# Patient Record
Sex: Male | Born: 1935 | Race: Black or African American | Hispanic: No | Smoking: Former smoker
Health system: Southern US, Community
[De-identification: ages and names within clinical notes are randomized; demographics above are authoritative.]

## PROBLEM LIST (undated history)

## (undated) DIAGNOSIS — I10 Essential (primary) hypertension: Secondary | ICD-10-CM

## (undated) DIAGNOSIS — Z9289 Personal history of other medical treatment: Secondary | ICD-10-CM

## (undated) DIAGNOSIS — C801 Malignant (primary) neoplasm, unspecified: Secondary | ICD-10-CM

## (undated) DIAGNOSIS — R350 Frequency of micturition: Secondary | ICD-10-CM

## (undated) DIAGNOSIS — M109 Gout, unspecified: Secondary | ICD-10-CM

## (undated) DIAGNOSIS — I499 Cardiac arrhythmia, unspecified: Secondary | ICD-10-CM

## (undated) DIAGNOSIS — D649 Anemia, unspecified: Secondary | ICD-10-CM

## (undated) DIAGNOSIS — R609 Edema, unspecified: Secondary | ICD-10-CM

## (undated) DIAGNOSIS — I4892 Unspecified atrial flutter: Secondary | ICD-10-CM

## (undated) DIAGNOSIS — M1990 Unspecified osteoarthritis, unspecified site: Secondary | ICD-10-CM

## (undated) HISTORY — DX: Unspecified atrial flutter: I48.92

## (undated) HISTORY — PX: CATARACT EXTRACTION, BILATERAL: SHX1313

## (undated) HISTORY — PX: MINOR HEMORRHOIDECTOMY: SHX6238

---

## 1997-06-24 ENCOUNTER — Ambulatory Visit (HOSPITAL_COMMUNITY): Admission: RE | Admit: 1997-06-24 | Discharge: 1997-06-24 | Payer: Self-pay | Admitting: Internal Medicine

## 1997-07-16 ENCOUNTER — Ambulatory Visit (HOSPITAL_COMMUNITY): Admission: RE | Admit: 1997-07-16 | Discharge: 1997-07-16 | Payer: Self-pay | Admitting: Internal Medicine

## 1998-06-01 ENCOUNTER — Ambulatory Visit (HOSPITAL_COMMUNITY): Admission: RE | Admit: 1998-06-01 | Discharge: 1998-06-01 | Payer: Self-pay | Admitting: Gastroenterology

## 2001-11-21 ENCOUNTER — Encounter: Payer: Self-pay | Admitting: Internal Medicine

## 2001-11-21 ENCOUNTER — Encounter: Admission: RE | Admit: 2001-11-21 | Discharge: 2001-11-21 | Payer: Self-pay | Admitting: Internal Medicine

## 2002-07-15 ENCOUNTER — Ambulatory Visit: Admission: RE | Admit: 2002-07-15 | Discharge: 2002-09-25 | Payer: Self-pay | Admitting: Radiation Oncology

## 2002-07-26 ENCOUNTER — Encounter: Admission: RE | Admit: 2002-07-26 | Discharge: 2002-07-26 | Payer: Self-pay | Admitting: Urology

## 2002-07-26 ENCOUNTER — Encounter: Payer: Self-pay | Admitting: Urology

## 2002-08-23 ENCOUNTER — Encounter: Payer: Self-pay | Admitting: Urology

## 2002-08-23 ENCOUNTER — Ambulatory Visit (HOSPITAL_BASED_OUTPATIENT_CLINIC_OR_DEPARTMENT_OTHER): Admission: RE | Admit: 2002-08-23 | Discharge: 2002-08-23 | Payer: Self-pay | Admitting: Urology

## 2006-02-13 ENCOUNTER — Encounter: Admission: RE | Admit: 2006-02-13 | Discharge: 2006-02-13 | Payer: Self-pay | Admitting: Internal Medicine

## 2008-04-10 ENCOUNTER — Encounter: Admission: RE | Admit: 2008-04-10 | Discharge: 2008-04-10 | Payer: Self-pay | Admitting: Internal Medicine

## 2008-08-29 ENCOUNTER — Encounter: Admission: RE | Admit: 2008-08-29 | Discharge: 2008-08-29 | Payer: Self-pay | Admitting: Orthopedic Surgery

## 2008-10-30 ENCOUNTER — Encounter: Payer: Self-pay | Admitting: Cardiovascular Disease

## 2008-11-04 ENCOUNTER — Ambulatory Visit (HOSPITAL_COMMUNITY): Admission: RE | Admit: 2008-11-04 | Discharge: 2008-11-04 | Payer: Self-pay | Admitting: Orthopedic Surgery

## 2010-01-20 ENCOUNTER — Encounter: Payer: Self-pay | Admitting: Cardiovascular Disease

## 2010-01-21 ENCOUNTER — Encounter: Payer: Self-pay | Admitting: Cardiovascular Disease

## 2010-01-22 ENCOUNTER — Ambulatory Visit (HOSPITAL_COMMUNITY)
Admission: RE | Admit: 2010-01-22 | Discharge: 2010-01-26 | Payer: Self-pay | Source: Home / Self Care | Admitting: Orthopedic Surgery

## 2010-01-22 ENCOUNTER — Encounter: Payer: Self-pay | Admitting: Cardiovascular Disease

## 2010-02-09 DIAGNOSIS — I10 Essential (primary) hypertension: Secondary | ICD-10-CM | POA: Insufficient documentation

## 2010-02-09 DIAGNOSIS — M109 Gout, unspecified: Secondary | ICD-10-CM

## 2010-02-10 ENCOUNTER — Ambulatory Visit: Payer: Self-pay | Admitting: Cardiovascular Disease

## 2010-02-10 DIAGNOSIS — I4892 Unspecified atrial flutter: Secondary | ICD-10-CM

## 2010-02-10 HISTORY — DX: Unspecified atrial flutter: I48.92

## 2010-02-17 ENCOUNTER — Telehealth (INDEPENDENT_AMBULATORY_CARE_PROVIDER_SITE_OTHER): Payer: Self-pay | Admitting: Radiology

## 2010-02-18 ENCOUNTER — Encounter: Payer: Self-pay | Admitting: Internal Medicine

## 2010-02-18 ENCOUNTER — Ambulatory Visit: Payer: Self-pay

## 2010-02-18 ENCOUNTER — Encounter (HOSPITAL_COMMUNITY)
Admission: RE | Admit: 2010-02-18 | Discharge: 2010-05-01 | Payer: Self-pay | Source: Home / Self Care | Attending: Cardiovascular Disease | Admitting: Cardiovascular Disease

## 2010-02-18 ENCOUNTER — Encounter (INDEPENDENT_AMBULATORY_CARE_PROVIDER_SITE_OTHER): Payer: Self-pay | Admitting: *Deleted

## 2010-02-18 ENCOUNTER — Ambulatory Visit: Payer: Self-pay | Admitting: Cardiovascular Disease

## 2010-02-18 ENCOUNTER — Ambulatory Visit (HOSPITAL_COMMUNITY)
Admission: RE | Admit: 2010-02-18 | Discharge: 2010-02-18 | Payer: Self-pay | Source: Home / Self Care | Admitting: Cardiovascular Disease

## 2010-02-18 ENCOUNTER — Ambulatory Visit: Payer: Self-pay | Admitting: Internal Medicine

## 2010-02-18 DIAGNOSIS — R9430 Abnormal result of cardiovascular function study, unspecified: Secondary | ICD-10-CM | POA: Insufficient documentation

## 2010-02-18 DIAGNOSIS — I4510 Unspecified right bundle-branch block: Secondary | ICD-10-CM | POA: Insufficient documentation

## 2010-02-19 LAB — CONVERTED CEMR LAB
Basophils Absolute: 0.1 10*3/uL (ref 0.0–0.1)
Calcium: 10 mg/dL (ref 8.4–10.5)
Eosinophils Relative: 1.8 % (ref 0.0–5.0)
GFR calc non Af Amer: 43.89 mL/min (ref >60)
Glucose, Bld: 86 mg/dL (ref 70–99)
Hemoglobin: 11.2 g/dL — ABNORMAL LOW (ref 13.0–17.0)
Lymphocytes Relative: 34.4 % (ref 12.0–46.0)
Monocytes Relative: 11.7 % (ref 3.0–12.0)
Neutro Abs: 3.3 10*3/uL (ref 1.4–7.7)
Potassium: 4 meq/L (ref 3.5–5.1)
Prothrombin Time: 10.5 s (ref 9.7–11.8)
RDW: 14.2 % (ref 11.5–14.6)
Sodium: 137 meq/L (ref 135–145)
WBC: 6.6 10*3/uL (ref 4.5–10.5)

## 2010-02-22 ENCOUNTER — Inpatient Hospital Stay (HOSPITAL_BASED_OUTPATIENT_CLINIC_OR_DEPARTMENT_OTHER): Admission: RE | Admit: 2010-02-22 | Discharge: 2010-02-22 | Payer: Self-pay | Admitting: Cardiovascular Disease

## 2010-02-22 ENCOUNTER — Encounter: Payer: Self-pay | Admitting: Cardiovascular Disease

## 2010-02-22 ENCOUNTER — Ambulatory Visit: Payer: Self-pay | Admitting: Cardiovascular Disease

## 2010-03-08 ENCOUNTER — Telehealth: Payer: Self-pay | Admitting: Cardiovascular Disease

## 2010-03-09 ENCOUNTER — Ambulatory Visit: Payer: Self-pay | Admitting: Cardiology

## 2010-03-09 ENCOUNTER — Inpatient Hospital Stay (HOSPITAL_COMMUNITY): Admission: RE | Admit: 2010-03-09 | Discharge: 2010-03-16 | Payer: Self-pay | Admitting: Orthopedic Surgery

## 2010-03-18 ENCOUNTER — Encounter: Payer: Self-pay | Admitting: Cardiovascular Disease

## 2010-03-18 LAB — CONVERTED CEMR LAB
POC INR: 4
Prothrombin Time: 4 s

## 2010-03-24 ENCOUNTER — Encounter: Payer: Self-pay | Admitting: Cardiology

## 2010-03-31 ENCOUNTER — Encounter: Payer: Self-pay | Admitting: Internal Medicine

## 2010-04-07 ENCOUNTER — Ambulatory Visit: Payer: Self-pay | Admitting: Internal Medicine

## 2010-04-12 ENCOUNTER — Ambulatory Visit: Payer: Self-pay | Admitting: Cardiology

## 2010-04-12 ENCOUNTER — Encounter: Payer: Self-pay | Admitting: Cardiovascular Disease

## 2010-04-12 ENCOUNTER — Ambulatory Visit: Payer: Self-pay | Admitting: Cardiovascular Disease

## 2010-04-12 DIAGNOSIS — Z96659 Presence of unspecified artificial knee joint: Secondary | ICD-10-CM | POA: Insufficient documentation

## 2010-06-01 NOTE — Consult Note (Signed)
Summary: [facility]. Pre-Op Consultation Report  [facility]. Pre-Op Consultation Report   Imported By: Earl Many 02/09/2010 16:39:10  _____________________________________________________________________  External Attachment:    Type:   Image     Comment:   External Document

## 2010-06-01 NOTE — Progress Notes (Signed)
Summary: NUC PRE=PROCEDURE  Phone Note Outgoing Call   Call placed by: Domenic Polite, CNMT,  February 17, 2010 4:01 PM Call placed to: Patient Reason for Call: Confirm/change Appt Summary of Call: Left message with information on Myoview Information Sheet (see scanned document for details).  Initial call taken by: Domenic Polite, CNMT,  February 17, 2010 4:01 PM     Nuclear Med Background Indications for Stress Test: Evaluation for Ischemia, Surgical Clearance  Indications Comments: LT KNEE       Nuclear Pre-Procedure Cardiac Risk Factors: Hypertension

## 2010-06-01 NOTE — Medication Information (Signed)
Summary: Coumadin Clinic  Anticoagulant Therapy  Managed by: Weston Brass, PharmD Referring MD: Charlton Haws, MD Supervising MD: Gala Romney MD, Reuel Boom Indication 1: Atrial Fibrillation Lab Used: [facility] Site: [facility] PT 14.7 INR POC 1.5 INR RANGE 2.0-2.5  Dietary changes: no    Health status changes: no    Bleeding/hemorrhagic complications: no    Recent/future hospitalizations: no    Any changes in medication regimen? no    Recent/future dental: no  Any missed doses?: no       Is patient compliant with meds? yes       Allergies: 1)  ! Asa  Anticoagulation Management History:      His anticoagulation is being managed by telephone today.  Positive risk factors for bleeding include an age of 27 years or older and presence of serious comorbidities.  The bleeding index is 'intermediate risk'.  Positive CHADS2 values include History of HTN and Age > 7 years old.  His last INR was 1.0 ratio.  Prothrombin time is 14.7.  Anticoagulation responsible provider: Bensimhon MD, Reuel Boom.  INR POC: 1.5.    Anticoagulation Management Assessment/Plan:      The target INR is 2.0-2.5.  The next INR is due 04/07/2010.  Anticoagulation instructions were given to patient.  Results were reviewed/authorized by Weston Brass, PharmD.  He was notified by Weston Brass PharmD.         Prior Anticoagulation Instructions: INR 1.4  Attempted to reach pt.  Line busy. Weston Brass PharmD  March 24, 2010 2:46 PM   Spoke with pt.  Take 1 1/2 tablets today then increase dose to 1 tablet every day except 1/2 tablet on Monday.  Recheck INR in 1 week.  Orders faxed to [facility].   Current Anticoagulation Instructions: INR 1.5  Spoke with pt.  Take 1 1/2 tablets today and tomorrow then increase dose to 1 tablet every day.  Recheck INR in 1 week.  Orders faxed to [facility].

## 2010-06-01 NOTE — Medication Information (Signed)
Summary: Coumadin Clinic  Anticoagulant Therapy  Managed by: Weston Brass, PharmD Referring MD: Charlton Haws, MD Supervising MD: Shirlee Latch MD, Dalton Indication 1: Atrial Fibrillation Lab Used: [facility] Site: [facility] PT 13.9 INR POC 1.4 INR RANGE 2.0-2.5  Dietary changes: yes       Details: ate green vegetables 2 times  Health status changes: no    Bleeding/hemorrhagic complications: no    Recent/future hospitalizations: no    Any changes in medication regimen? no    Recent/future dental: no  Any missed doses?: no       Is patient compliant with meds? yes       Allergies: 1)  ! Asa  Anticoagulation Management History:      His anticoagulation is being managed by telephone today.  Positive risk factors for bleeding include an age of 75 years or older and presence of serious comorbidities.  The bleeding index is 'intermediate risk'.  Positive CHADS2 values include History of HTN and Age > 1 years old.  His last INR was 1.0 ratio.  Prothrombin time is 13.9.  Anticoagulation responsible Aaliyana Fredericks: Shirlee Latch MD, Dalton.  INR POC: 1.4.    Anticoagulation Management Assessment/Plan:      The target INR is 2.0-2.5.  The next INR is due 03/31/2010.  Anticoagulation instructions were given to patient.  Results were reviewed/authorized by Weston Brass, PharmD.  He was notified by Weston Brass PharmD.         Prior Anticoagulation Instructions: INR 4.0  Do NOT take coumadin today.  Then start NEW schedule of 1/2 tablet on Friday and Monday and 1 tablet all other days.  Recheck INR in 5 days.  Instructions given to patient.  EAC  Current Anticoagulation Instructions: INR 1.4  Attempted to reach pt.  Line busy. Weston Brass PharmD  March 24, 2010 2:46 PM   Spoke with pt.  Take 1 1/2 tablets today then increase dose to 1 tablet every day except 1/2 tablet on Monday.  Recheck INR in 1 week.  Orders faxed to [facility].

## 2010-06-01 NOTE — Assessment & Plan Note (Signed)
Summary: Cardiology Nuclear Testing  Nuclear Med Background Indications for Stress Test: Evaluation for Ischemia, Surgical Clearance  Indications Comments: Pending (L) knee surgery by Dr. Dorene Grebe   History Comments: No documented CAD. h/o atrial flutter  Symptoms: DOE    Nuclear Pre-Procedure Cardiac Risk Factors: History of Smoking, Hypertension, Obesity, RBBB Caffeine/Decaff Intake: None NPO After: 5:30 PM Lungs: Clear.  O2 Sat 96% on RA. IV 0.9% NS with Angio Cath: 22g     IV Site: R Antecubital IV Started by: Bonnita Levan, RN Chest Size (in) 44     Height (in): 65 Weight (lb): 197 BMI: 32.90  Nuclear Med Study 1 or 2 day study:  1 day     Stress Test Type:  Eugenie Birks Reading MD:  Arvilla Meres, MD     Referring MD:  Charlton Haws, MD Resting Radionuclide:  Technetium 79m Tetrofosmin     Resting Radionuclide Dose:  11 mCi  Stress Radionuclide:  Technetium 58m Tetrofosmin     Stress Radionuclide Dose:  33 mCi   Stress Protocol   Lexiscan: 0.4 mg   Stress Test Technologist:  [employer]a [employer], CMA-N     Nuclear Technologist:  Domenic Polite, CNMT  Rest Procedure  Myocardial perfusion imaging was performed at rest 45 minutes following the intravenous administration of Technetium 74m Tetrofosmin.  Stress Procedure  The patient received IV Lexiscan 0.4 mg over 15-seconds.  Technetium 20m Tetrofosmin injected at 30-seconds.  There were no significant changes with infusion.  Quantitative spect images were obtained after a 45 minute delay.  QPS Raw Data Images:  Normal; no motion artifact; normal heart/lung ratio. Stress Images:  There is decreased uptake in the inferior wall Rest Images:  There is decreased uptake in the inferior wall. Subtraction (SDS):  There is a primarily fixed defect in the inferior wall suggestive of previous infarct with trivial peri-infarct ischemia. Transient Ischemic Dilatation:  .92  (Normal <1.22)  Lung/Heart Ratio:  .25  (Normal  <0.45)  Quantitative Gated Spect Images QGS EDV:  107 ml QGS ESV:  38 ml QGS EF:  64 % QGS cine images:  Normal.  Findings Abnormal nuclear study      Overall Impression  Exercise Capacity: Lexiscan with no exercise. ECG Impression: No significant ST segment change suggestive of ischemia. Overall Impression: Abnormal stress nuclear study. Overall Impression Comments: There is a primarily fixed defect in the inferior wall suggestive of previous infarct with trivial peri-infarct ischemia.

## 2010-06-01 NOTE — Assessment & Plan Note (Signed)
Summary: surgical clearance/ echo and myoview today at 8am/sl   CC:  pt wil call us back about meds.  History of Present Illness: Glenroy is referred by Dorene Grebe for preop clearance.  He needs a left knee replacement.  He has HTN and chronic RBBB.l  He was found to have atrial flutter on preop ECG.  He has never had a heart problem.  He uses an exercise bicycle 2x/day with no difficulty.  He has not had any recent surgery.  He had right arthroscopic knee surgery many years ago wtih no complicaitons.  He denies history of CAD, sscp, dyspnea, palpitations, edema or syncope.  He has never had a TIA or stroke.  he is compliant with his meds.  Many years ago he had polyps removed and bled "freely" so he was told not to take ASA He is otherwise low risk for an octogenarian  I reviewed his myovue and echo from today.  Echo showed no discrete RWMA;s EF 60% with mild MR Myovue showed inferior wall infarct with ischemia.  His ECG for the myovue showed NSR and no flutter with RBBB  Given his functional status, age, arrythmia and positive myovue I think he should have a cath before elective orthopedic surgery.  Risks discussed.  Willing to proceed.  Would like to be done Monday.  Will arrange with Dr Excell Seltzer  Current Problems (verified): 1)  Atrial Flutter  (ICD-427.32) 2)  Hypertension  (ICD-401.9) 3)  Gout  (ICD-274.9)  Current Medications (verified): 1)  Benicar .... Take One Tablet By Mouth Daily 2)  Allopurinol .Marland Kitchen.. 1 Tab By Mouth Once Daily 3)  Fosamax .Marland Kitchen.. 1 Tab By Mouth Once Daily 4)  Atenolol 50 Mg Tabs (Atenolol) .... Take I Tab By Mouth Daily  Allergies (verified): 1)  ! Jonne Ply  Past History:  Past Medical History: Last updated: 02/09/2010 Atrial Flutter: seen on preop orhto ECG 9/11 HYPERTENSION  GOUT   History of right knee arthroscopy  Past Surgical History: Last updated: 02/09/2010  Right knee partial medial and lateral meniscectomy.   Family History: Last updated:  02/09/2010 noncontributory  Social History: Last updated: 02/10/2010 Widowed Neice looks in on him Nonsmoker Nondrinker Retired from post office  Review of Systems       Denies fever, malais, weight loss, blurry vision, decreased visual acuity, cough, sputum, SOB, hemoptysis, pleuritic pain, palpitaitons, heartburn, abdominal pain, melena, lower extremity edema, claudication, or rash.   Vital Signs:  Patient profile:   77 year old male Weight:      185 pounds Pulse rate:   50 / minute BP sitting:   130 / 65  (left arm)  Vitals Entered By: Kem Parkinson (February 18, 2010 1:47 PM)  Physical Exam  General:  Affect appropriate Healthy:  appears stated age HEENT: normal Neck supple with no adenopathy JVP normal no bruits no thyromegaly Lungs clear with no wheezing and good diaphragmatic motion Heart:  S1/S2 no murmur,rub, gallop or click PMI normal Abdomen: benighn, BS positve, no tenderness, no AAA no bruit.  No HSM or HJR Distal pulses intact with no bruits No edema Neuro non-focal Skin warm and dry    Impression & Recommendations:  Problem # 1:  NONSPECIFIC ABNORMAL UNSPEC CV FUNCTION STUDY (ICD-794.30) Preoperative evaluation with positvie myovue inferior ischemia and infarct.  Cath on Monday Orders: TLB-BMP (Basic Metabolic Panel-BMET) (80048-METABOL) TLB-CBC Platelet - w/Differential (85025-CBCD) TLB-PT (Protime) (85610-PTP) T-2 View CXR (71020TC) Cardiac Catheterization (Cardiac Cath)  Problem # 2:  ATRIAL FLUTTER (ICD-427.32)  Paroxysmal.  Continue BB.  No antiarrythmics as cornary anatomy not defined.   His updated medication list for this problem includes:    Atenolol 50 Mg Tabs (Atenolol) .Marland Kitchen... Take i tab by mouth daily  Problem # 3:  HYPERTENSION (ICD-401.9) Well controlled  His updated medication list for this problem includes:    Atenolol 50 Mg Tabs (Atenolol) .Marland Kitchen... Take i tab by mouth daily  Problem # 4:  RBBB (ICD-426.4) No evidence of  high grade heart block.  Continue BB His updated medication list for this problem includes:    Atenolol 50 Mg Tabs (Atenolol) .Marland Kitchen... Take i tab by mouth daily  Patient Instructions: 1)  Your physician recommends that you schedule a follow-up appointment in: 4weeks 2)  Your physician has requested that you have a cardiac catheterization.  Cardiac catheterization is used to diagnose and/or treat various heart conditions. Doctors may recommend this procedure for a number of different reasons. The most common reason is to evaluate chest pain. Chest pain can be a symptom of coronary artery disease (CAD), and cardiac catheterization can show whether plaque is narrowing or blocking your heart's arteries. This procedure is also used to evaluate the valves, as well as measure the blood flow and oxygen levels in different parts of your heart.  For further information please visit [email or web address]  Please follow instruction sheet, as given.

## 2010-06-01 NOTE — Medication Information (Signed)
Summary: rov/sp  Anticoagulant Therapy  Managed by: Weston Brass, PharmD Referring MD: Charlton Haws, MD Supervising MD: Ladona Ridgel MD, Sharlot Gowda Indication 1: Atrial Fibrillation Lab Used: [facility] Site: [facility] INR POC 3.2 INR RANGE 2.0-2.5  Dietary changes: no    Health status changes: no    Bleeding/hemorrhagic complications: no    Recent/future hospitalizations: no    Any changes in medication regimen? no    Recent/future dental: no  Any missed doses?: no       Is patient compliant with meds? yes       Allergies: 1)  ! Asa  Anticoagulation Management History:      The patient is taking warfarin and comes in today for a routine follow up visit.  Positive risk factors for bleeding include an age of 2 years or older and presence of serious comorbidities.  The bleeding index is 'intermediate risk'.  Positive CHADS2 values include History of HTN and Age > 64 years old.  His last INR was 1.0 ratio.  Anticoagulation responsible provider: Ladona Ridgel MD, Sharlot Gowda.  INR POC: 3.2.  Cuvette Lot#: [id number].  Exp: 06/2011.    Anticoagulation Management Assessment/Plan:      The target INR is 2.0-2.5.  The next INR is due 04/12/2010.  Anticoagulation instructions were given to patient.  Results were reviewed/authorized by Weston Brass, PharmD.  He was notified by Weston Brass PharmD.         Prior Anticoagulation Instructions: INR 1.5  Spoke with pt.  Take 1 1/2 tablets today and tomorrow then increase dose to 1 tablet every day.  Recheck INR in 1 week.  Orders faxed to [facility].   Current Anticoagulation Instructions: INR 3.2  Skip today's dose of Coumadin then decrease dose to 1 tablet every day except 1/2 tablet on Monday.  Recheck INR in 1 week.

## 2010-06-01 NOTE — Assessment & Plan Note (Signed)
Summary: np3/abn ekg   History of Present Illness: [name] is referred by Dorene Grebe for preop clearance.  He needs a left knee replacement.  He has HTN and chronic RBBB.l  He was found to have atrial flutter on preop ECG.  He has never had a heart problem.  He uses an exercise bicycle 2x/day with no difficulty.  He has not had any recent surgery.  He had right arthroscopic knee surgery many years ago wtih no complicaitons.  He denies history of CAD, sscp, dyspnea, palpitations, edema or syncope.  He has never had a TIA or stroke.  he is compliant with his meds.  Many years ago he had polyps removed and bled "freely" so he was told not to take ASA He is otherwise low risk for an octogenarian  Current Problems (verified): 1)  Atrial Flutter  (ICD-427.32) 2)  Hypertension  (ICD-401.9) 3)  Gout  (ICD-274.9)  Current Medications (verified): 1)  Benicar .... Take One Tablet By Mouth Daily 2)  Allopurinol .Marland Kitchen.. 1 Tab By Mouth Once Daily 3)  Fosamax .Marland Kitchen.. 1 Tab By Mouth Once Daily 4)  Atenolol 50 Mg Tabs (Atenolol) .... Take I Tab By Mouth Daily  Allergies (verified): 1)  ! Jonne Ply  Past History:  Past Medical History: Last updated: 02/09/2010 Atrial Flutter: seen on preop orhto ECG 9/11 HYPERTENSION  GOUT   History of right knee arthroscopy  Past Surgical History: Last updated: 02/09/2010  Right knee partial medial and lateral meniscectomy.   Family History: Last updated: 02/09/2010 noncontributory  Social History: Last updated: 02/10/2010 Widowed Neice looks in on him Nonsmoker Nondrinker Retired from post office  Social History: Widowed Neice looks in on him Nonsmoker Nondrinker Retired from post office  Review of Systems       Denies fever, malais, weight loss, blurry vision, decreased visual acuity, cough, sputum, SOB, hemoptysis, pleuritic pain, palpitaitons, heartburn, abdominal pain, melena, lower extremity edema, claudication, or rash.   Vital Signs:  Patient  profile:   77 year old male Pulse rate:   120 / minute BP supine:   130 / 70  Physical Exam  General:  Affect appropriate Healthy:  appears stated age HEENT: normal Neck supple with no adenopathy JVP normal no bruits no thyromegaly Lungs clear with no wheezing and good diaphragmatic motion Heart:  S1/S2 systolic  murmur,rub, gallop or click PMI normal Abdomen: benighn, BS positve, no tenderness, no AAA no bruit.  No HSM or HJR Distal pulses intact with no bruits No edema Neuro non-focal Skin warm and dry Arthritic left knee with decreased ROM   Impression & Recommendations:  Problem # 1:  ATRIAL FLUTTER (ICD-427.32) Needs rate controll.  Add BB.  R/O stuctural heart disease with echo and myovue.  Deal with conversion, anticoagulation and possible ablatin post op as stress of surgery may cause recurrent flutter and would need to anticoagulate continuously for 3-4 weeks His updated medication list for this problem includes:    Atenolol 50 Mg Tabs (Atenolol) .Marland Kitchen... Take i tab by mouth daily  Orders: Echocardiogram (Echo)  Problem # 2:  HYPERTENSION (ICD-401.9) Continue Benicar His updated medication list for this problem includes:    Atenolol 50 Mg Tabs (Atenolol) .Marland Kitchen... Take i tab by mouth daily  Orders: Nuclear Stress Test (Nuc Stress Test)  Problem # 3:  GOUT (ICD-274.9) Likely contributory to arthritis.  F/U uric acid level primary and consdier Allopurinol  Patient Instructions: 1)  Your physician recommends that you schedule a follow-up appointment in: schedule  appoint soon after testing done for surgical clearance 2)  Your physician has recommended you make the following change in your medication: please start atenolol 50mg  1 tab by mouth everyday 3)  Your physician has requested that you have an echocardiogram.  Echocardiography is a painless test that uses sound waves to create images of your heart. It provides your doctor with information about the size and shape of  your heart and how well your heart's chambers and valves are working.  This procedure takes approximately one hour. There are no restrictions for this procedure. 4)  Your physician has requested that you have an adenosine myoview.  For further information please visit [email or web address]  Please follow instruction sheet, as given. Prescriptions: ATENOLOL 50 MG TABS (ATENOLOL) take i tab by mouth daily  #90 x 3   Entered by:   Ledon Snare, RN   Authorized by:   Colon Branch, MD, [facility]   Signed by:   Ledon Snare, RN on 02/10/2010   Method used:   Electronically to        [facility] [phone]* (retail)       [address]       [place], [place]  [id number]       Ph: [phone] or [phone]       Fax: [phone]   RxID:   [id number]    EKG Report  Procedure date:  01/22/2010  Findings:      Atrial Flutter with RBBB RBBB seen on old ECG 2010 Flutter is new

## 2010-06-01 NOTE — Medication Information (Signed)
Summary: Coumadin Clinic  Anticoagulant Therapy  Managed by: Eda Keys, PharmD Referring MD: Charlton Haws, MD Supervising MD: Clifton James MD, Cristal Deer Indication 1: Atrial Fibrillation Lab Used: [facility] Site: [address] PT 4.0 INR POC 4.0 INR RANGE 2.0-2.5          Comments: Patient is currently on coumadin for DVT Px s/p left TKR on Nov 8th.  Patient was discharged on November 14th.  Patient was given coumadin 5 mg daily while in hospital and INRs ranged from 1.34 - 2.38 while in hospital.  His INR is being drawn by [place].  Patient was discharged with instructions to continue coumadin 5 mg daily.  Patient reports that he has received coumadin education from [facility] RN.    Allergies: 1)  ! Asa  Anticoagulation Management History:      His anticoagulation is being managed by telephone today.  Positive risk factors for bleeding include an age of 75 years or older and presence of serious comorbidities.  The bleeding index is 'intermediate risk'.  Positive CHADS2 values include History of HTN and Age > 52 years old.  His last INR was 1.0 ratio.  Prothrombin time is 4.0.  Anticoagulation responsible provider: Clifton James MD, Cristal Deer.  INR POC: 4.0.    Anticoagulation Management Assessment/Plan:      The next INR is due 03/23/2010.  Anticoagulation instructions were given to patient.  Results were reviewed/authorized by Eda Keys, PharmD.  He was notified by Eda Keys.         Current Anticoagulation Instructions: INR 4.0  Do NOT take coumadin today.  Then start NEW schedule of 1/2 tablet on Friday and Monday and 1 tablet all other days.  Recheck INR in 5 days.  Instructions given to patient.  EAC

## 2010-06-01 NOTE — Letter (Signed)
Summary: Cardiac Catheterization Instructions- [facility]  [facility], Main Office  [address]   [place], [place] [address]   Phone: [phone]  Fax: [phone]     02/18/2010 MRN: [id number]  [facility] [address] [place], [place]  [address]  Dear Mr. Salminen,   You are scheduled for a Cardiac Catheterization on MONDAY 02-22-10 with Dr. Excell Seltzer  Please arrive to the 1st floor of the Heart and Vascular Center at [facility] at      6:30 am      on the day of your procedure. Please do not arrive before 6:30 a.m. Call the Heart and Vascular Center at [phone] if you are unable to make your appointmnet. The Code to get into the parking garage under the building is  0200. Take the elevators to the 1st floor. You must have someone to drive you home. Someone must be with you for the first 24 hours after you arrive home. Please wear clothes that are easy to get on and off and wear slip-on shoes. Do not eat or drink after midnight except water with your medications that morning. Bring all your medications and current insurance cards with you.  ___ DO NOT take these medications before your procedure: ________________________________________________________________  ___ Make sure you take your aspirin.  ___ You may take ALL of your medications with water that morning. ________________________________________________________________________________________________________________________________  ___ DO NOT take ANY medications before your procedure.  ___ Pre-med instructions:  ________________________________________________________________________________________________________________________________  The usual length of stay after your procedure is 2 to 3 hours. This can vary.  If you have any questions, please call the office at the number listed above.   Deliah Goody, RN

## 2010-06-01 NOTE — Progress Notes (Signed)
Summary: ekg still show atrial flutter  Phone Note From Other Clinic Call back at [facility] Phone [phone]   Caller: dr. dean office- sherrri [phone] Request: Talk with Nurse Summary of Call: pt is schedule for total knee / ekg still showing atrial flutter. pls advise.  Initial call taken by: Lorne Skeens,  March 08, 2010 2:47 PM  Follow-up for Phone Call        spoke with sherri, pt is scheduled for surgery tomorrow and they want to make sure from an atrial flutter respect is he still clear. will discuss with dr allred(DOD). Deliah Goody, RN  March 08, 2010 3:44 PM  discussed above with dr allred, clearence given to pt for procedure. note faxed to dr dean's office Deliah Goody, RN  March 08, 2010 4:18 PM

## 2010-06-01 NOTE — Cardiovascular Report (Signed)
Summary: Pre-Cath Orders  Pre-Cath Orders   Imported By: Marylou Mccoy 03/08/2010 11:27:17  _____________________________________________________________________  External Attachment:    Type:   Image     Comment:   External Document

## 2010-06-03 NOTE — Assessment & Plan Note (Signed)
Summary: eph/jt   CC:  follow up.Marland KitchenMarland Kitchenpt will call us back to verify meds.  History of Present Illness: Zeven is seen today for F/U of PAF.  He is S/P knee surgery where he had PAF.  Preop w/u with cath showed normal cors with good LV function.  False positive myovue.  Tends to be bradycardic and BB dose recently lowered.  His surgery was 11/8 and since he is in NSR we should be able to stop his coumadin.  I will talk with Dr Dorene Grebe regarding length of coumadin post knee but I suspect since patient is ambulatory and there is no swelling or clincial evidence of DVT he should be able to come off coumadin from an orthopedic perspective  Current Problems (verified): 1)  Rbbb  (ICD-426.4) 2)  Nonspecific Abnormal Unspec Cv Function Study  (ICD-794.30) 3)  Atrial Flutter  (ICD-427.32) 4)  Hypertension  (ICD-401.9) 5)  Gout  (ICD-274.9)  Current Medications (verified): 1)  Allopurinol 100 Mg Tabs (Allopurinol) .Marland Kitchen.. 1 Tab By Mouth Once Daily 2)  Fosamax .Marland Kitchen.. 1 Tab By Mouth Once Daily 3)  Atenolol 50 Mg Tabs (Atenolol) .Marland Kitchen.. 1 Tab By Mouth Once Daily 4)  Methocarbamol 500 Mg Tabs (Methocarbamol) .... As Needed 5)  Coumadin  (Warfarin Sodium) .... As Directed  Allergies (verified): 1)  ! Jonne Ply  Past History:  Past Medical History: Last updated: 02/09/2010 Atrial Flutter: seen on preop orhto ECG 9/11 HYPERTENSION  GOUT   History of right knee arthroscopy  Past Surgical History: Last updated: 02/09/2010  Right knee partial medial and lateral meniscectomy.   Family History: Last updated: 02/09/2010 noncontributory  Social History: Last updated: 02/10/2010 Widowed Neice looks in on him Nonsmoker Nondrinker Retired from post office  Review of Systems       Denies fever, malais, weight loss, blurry vision, decreased visual acuity, cough, sputum, SOB, hemoptysis, pleuritic pain, palpitaitons, heartburn, abdominal pain, melena, lower extremity edema, claudication, or  rash.   Vital Signs:  Patient profile:   77 year old male Height:      65 inches Weight:      197 pounds BMI:     32.90 Pulse rate:   57 / minute Resp:     14 per minute BP sitting:   131 / 63  (left arm)  Vitals Entered By: Kem Parkinson (April 12, 2010 11:08 AM)  Physical Exam  General:  Affect appropriate Healthy:  appears stated age HEENT: normal Neck supple with no adenopathy JVP normal no bruits no thyromegaly Lungs clear with no wheezing and good diaphragmatic motion Heart:  S1/S2 no murmur,rub, gallop or click PMI normal Abdomen: benighn, BS positve, no tenderness, no AAA no bruit.  No HSM or HJR Distal pulses intact with no bruits No edema Neuro non-focal Skin warm and dry S/P left TKR   Impression & Recommendations:  Problem # 1:  ATRIAL FLUTTER (ICD-427.32) Resolved  Continue low dose BB  Stop coumadin His updated medication list for this problem includes:    Atenolol 50 Mg Tabs (Atenolol) .Marland Kitchen... 1 tab by mouth once daily  Problem # 2:  HYPERTENSION (ICD-401.9) Well controlled His updated medication list for this problem includes:    Atenolol 50 Mg Tabs (Atenolol) .Marland Kitchen... 1 tab by mouth once daily    Aspirin 81 Mg Tbec (Aspirin) .Marland Kitchen... Take one tablet by mouth daily  Problem # 3:  NONSPECIFIC ABNORMAL UNSPEC CV FUNCTION STUDY (ICD-794.30) Abnormal myovue but no critical CAD on cath.  Baby ASA and  BB  Problem # 4:  TOTAL KNEE REPLACEMENT, LEFT, HX OF (ICD-V43.65) Personallhy spoke with DR Dorene Grebe today.  Normally only keeps people on coumadin for a month post knee for DVT prophylaxis.  Will stop coumadin today and start 81mg  ASA  Patient Instructions: 1)  Your physician wants you to follow-up in: 6 MONTHS  You will receive a reminder letter in the mail two months in advance. If you don't receive a letter, please call our office to schedule the follow-up appointment. 2)  Your physician has recommended you make the following change in your  medication: STOP WARFARIN 3)  START ASPIRIN 81MG  ONCE DAILY

## 2010-06-03 NOTE — Medication Information (Signed)
Summary: rov/sp  Anticoagulant Therapy  Managed by: Inactive Referring MD: Charlton Haws, MD Supervising MD: Eden Emms MD, Theron Arista Indication 1: Atrial Fibrillation Lab Used: [facility] Site: [facility] INR RANGE 2.0-2.5          Comments: coumadin discontinued   Allergies: 1)  ! Asa  Anticoagulation Management History:      Positive risk factors for bleeding include an age of 38 years or older and presence of serious comorbidities.  The bleeding index is 'intermediate risk'.  Positive CHADS2 values include History of HTN and Age > 79 years old.  His last INR was 1.0 ratio.  Anticoagulation responsible provider: Eden Emms MD, Theron Arista.  Exp: 06/2011.    Anticoagulation Management Assessment/Plan:      The target INR is 2.0-2.5.  The next INR is due 04/12/2010.  Anticoagulation instructions were given to patient.  Results were reviewed/authorized by Inactive.         Prior Anticoagulation Instructions: INR 3.2  Skip today's dose of Coumadin then decrease dose to 1 tablet every day except 1/2 tablet on Monday.  Recheck INR in 1 week.

## 2010-07-13 LAB — BASIC METABOLIC PANEL
BUN: 20 mg/dL (ref 6–23)
CO2: 27 mEq/L (ref 19–32)
CO2: 28 mEq/L (ref 19–32)
CO2: 29 mEq/L (ref 19–32)
Calcium: 8.6 mg/dL (ref 8.4–10.5)
Calcium: 9 mg/dL (ref 8.4–10.5)
Calcium: 9.3 mg/dL (ref 8.4–10.5)
Calcium: 9.7 mg/dL (ref 8.4–10.5)
Chloride: 102 mEq/L (ref 96–112)
Chloride: 102 mEq/L (ref 96–112)
Creatinine, Ser: 1.26 mg/dL (ref 0.4–1.5)
GFR calc Af Amer: 45 mL/min — ABNORMAL LOW (ref >60)
GFR calc Af Amer: 54 mL/min — ABNORMAL LOW (ref >60)
GFR calc Af Amer: >60 mL/min (ref >60)
GFR calc non Af Amer: 38 mL/min — ABNORMAL LOW (ref >60)
GFR calc non Af Amer: 40 mL/min — ABNORMAL LOW (ref >60)
GFR calc non Af Amer: 53 mL/min — ABNORMAL LOW (ref >60)
Glucose, Bld: 121 mg/dL — ABNORMAL HIGH (ref 70–99)
Potassium: 3.9 mEq/L (ref 3.5–5.1)
Sodium: 135 mEq/L (ref 135–145)
Sodium: 136 mEq/L (ref 135–145)
Sodium: 137 mEq/L (ref 135–145)
Sodium: 138 mEq/L (ref 135–145)

## 2010-07-13 LAB — DIFFERENTIAL
Eosinophils Absolute: 0.1 10*3/uL (ref 0.0–0.7)
Eosinophils Relative: 2 % (ref 0–5)
Lymphs Abs: 2.2 10*3/uL (ref 0.7–4.0)
Monocytes Absolute: 0.5 10*3/uL (ref 0.1–1.0)
Monocytes Relative: 9 % (ref 3–12)

## 2010-07-13 LAB — MAGNESIUM
Magnesium: 1.4 mg/dL — ABNORMAL LOW (ref 1.5–2.5)
Magnesium: 1.8 mg/dL (ref 1.5–2.5)

## 2010-07-13 LAB — CBC
HCT: 20.8 % — ABNORMAL LOW (ref 39.0–52.0)
HCT: 25.6 % — ABNORMAL LOW (ref 39.0–52.0)
HCT: 35.4 % — ABNORMAL LOW (ref 39.0–52.0)
Hemoglobin: 11.9 g/dL — ABNORMAL LOW (ref 13.0–17.0)
Hemoglobin: 6.8 g/dL — CL (ref 13.0–17.0)
Hemoglobin: 8.2 g/dL — ABNORMAL LOW (ref 13.0–17.0)
Hemoglobin: 8.7 g/dL — ABNORMAL LOW (ref 13.0–17.0)
Hemoglobin: 9 g/dL — ABNORMAL LOW (ref 13.0–17.0)
MCH: 29.9 pg (ref 26.0–34.0)
MCH: 30.6 pg (ref 26.0–34.0)
MCHC: 33.1 g/dL (ref 30.0–36.0)
MCV: 90.4 fL (ref 78.0–100.0)
MCV: 91 fL (ref 78.0–100.0)
RBC: 2.67 MIL/uL — ABNORMAL LOW (ref 4.22–5.81)
RBC: 2.85 MIL/uL — ABNORMAL LOW (ref 4.22–5.81)
RBC: 3.89 MIL/uL — ABNORMAL LOW (ref 4.22–5.81)
RDW: 14.3 % (ref 11.5–15.5)
WBC: 7.8 10*3/uL (ref 4.0–10.5)
WBC: 8.7 10*3/uL (ref 4.0–10.5)

## 2010-07-13 LAB — PROTIME-INR
INR: 0.97 (ref 0.00–1.49)
INR: 1.04 (ref 0.00–1.49)
INR: 1.34 (ref 0.00–1.49)
INR: 1.66 — ABNORMAL HIGH (ref 0.00–1.49)
INR: 2.11 — ABNORMAL HIGH (ref 0.00–1.49)
Prothrombin Time: 13.8 seconds (ref 11.6–15.2)
Prothrombin Time: 16.8 seconds — ABNORMAL HIGH (ref 11.6–15.2)
Prothrombin Time: 22 seconds — ABNORMAL HIGH (ref 11.6–15.2)
Prothrombin Time: 23.8 seconds — ABNORMAL HIGH (ref 11.6–15.2)

## 2010-07-13 LAB — URINALYSIS, ROUTINE W REFLEX MICROSCOPIC
Bilirubin Urine: NEGATIVE
Hgb urine dipstick: NEGATIVE
Nitrite: NEGATIVE
Specific Gravity, Urine: 1.013 (ref 1.005–1.030)
pH: 6.5 (ref 5.0–8.0)

## 2010-07-13 LAB — TYPE AND SCREEN
Antibody Screen: NEGATIVE
Unit division: 0

## 2010-07-13 LAB — URINE CULTURE: Culture  Setup Time: 201111041432

## 2010-07-15 LAB — TYPE AND SCREEN: ABO/RH(D): O POS

## 2010-07-15 LAB — BASIC METABOLIC PANEL
CO2: 28 mEq/L (ref 19–32)
Calcium: 10.8 mg/dL — ABNORMAL HIGH (ref 8.4–10.5)
Chloride: 95 mEq/L — ABNORMAL LOW (ref 96–112)
Glucose, Bld: 102 mg/dL — ABNORMAL HIGH (ref 70–99)
Sodium: 132 mEq/L — ABNORMAL LOW (ref 135–145)

## 2010-07-15 LAB — DIFFERENTIAL
Basophils Relative: 1 % (ref 0–1)
Eosinophils Absolute: 0.1 10*3/uL (ref 0.0–0.7)
Eosinophils Relative: 2 % (ref 0–5)
Lymphs Abs: 2.8 10*3/uL (ref 0.7–4.0)
Monocytes Absolute: 0.7 10*3/uL (ref 0.1–1.0)
Monocytes Relative: 11 % (ref 3–12)
Neutrophils Relative %: 43 % (ref 43–77)

## 2010-07-15 LAB — CBC
HCT: 33.1 % — ABNORMAL LOW (ref 39.0–52.0)
Hemoglobin: 11.1 g/dL — ABNORMAL LOW (ref 13.0–17.0)
MCH: 30.6 pg (ref 26.0–34.0)
MCHC: 33.5 g/dL (ref 30.0–36.0)
MCV: 91.2 fL (ref 78.0–100.0)
RBC: 3.63 MIL/uL — ABNORMAL LOW (ref 4.22–5.81)

## 2010-07-15 LAB — URINE CULTURE
Colony Count: NO GROWTH
Culture  Setup Time: 201109231049

## 2010-07-15 LAB — URINALYSIS, ROUTINE W REFLEX MICROSCOPIC
Bilirubin Urine: NEGATIVE
Glucose, UA: NEGATIVE mg/dL
Hgb urine dipstick: NEGATIVE
Ketones, ur: NEGATIVE mg/dL
Nitrite: NEGATIVE
Specific Gravity, Urine: 1.008 (ref 1.005–1.030)
pH: 5.5 (ref 5.0–8.0)

## 2010-07-15 LAB — ABO/RH: ABO/RH(D): O POS

## 2010-08-08 LAB — BASIC METABOLIC PANEL
CO2: 28 mEq/L (ref 19–32)
Chloride: 105 mEq/L (ref 96–112)
Creatinine, Ser: 1.3 mg/dL (ref 0.4–1.5)
GFR calc Af Amer: >60 mL/min (ref >60)
Glucose, Bld: 122 mg/dL — ABNORMAL HIGH (ref 70–99)
Sodium: 141 mEq/L (ref 135–145)

## 2010-08-08 LAB — CBC
Hemoglobin: 11.4 g/dL — ABNORMAL LOW (ref 13.0–17.0)
MCHC: 34.5 g/dL (ref 30.0–36.0)
MCV: 95.4 fL (ref 78.0–100.0)
RBC: 3.47 MIL/uL — ABNORMAL LOW (ref 4.22–5.81)
WBC: 6.8 10*3/uL (ref 4.0–10.5)

## 2010-09-14 NOTE — Op Note (Signed)
NAMECASSANDRA, [name]               ACCOUNT NO.:  [id number]   MEDICAL RECORD NO.:  [id number]          PATIENT TYPE:  AMB   LOCATION:  SDS                          FACILITY:  [facility]   PHYSICIAN:  Burnard Bunting, M.D.    DATE OF BIRTH:  10-22-1923   DATE OF PROCEDURE:  11/04/2008  DATE OF DISCHARGE:  11/04/2008                               OPERATIVE REPORT   PREOPERATIVE DIAGNOSIS:  Right knee medial and lateral meniscal tear and  osteoarthritis.   POSTOPERATIVE DIAGNOSIS:  Right knee medial and lateral meniscal tear  and osteoarthritis.   PROCEDURE:  Right knee partial medial and lateral meniscectomy.   SURGEON:  Burnard Bunting, MD   ASSISTANT:  None.   ANESTHESIA:  LMA general plus local.   INDICATIONS:  [name] is an 77 year old patient with right knee  pain who presents now for operative management after failure of  conservative management and explanation of risks and benefits.   OPERATIVE FINDINGS:  1. Examination under anesthesia, range of motion 0-30 with stability      varus-valgus stress.  ACL and PCL intact.  No posterolateral      rotatory instability is noted.  2. Diagnostic operative arthroscopy.      a.     Intact patellofemoral compartment with only mild       chondromalacia and wear on the trochlea.      b.     No loose bodies in medial and lateral gutter.      c.     Synovitis within the suprapatellar pouch.      d.     Intact lateral compartment articular cartilage, but with the       anterior horn of the lateral meniscal tear.      e.     Intact ACL and PCL.      f.     Grade 4 chondromalacia on the medial femoral condyle, 50%       with about 30% of the medial tibial plateau with exposed grade 4       chondromalacia with posterior horn of the medial meniscal tear       with displaced fragment in the notch.   PROCEDURE IN DETAIL:  The patient was brought to the operating room  where LMA plus general anesthetic was induced.  A time-out was called.  Right leg and foot was prepped with DuraPrep solution and draped in a  sterile manner.  Anterior-inferolateral and anterior-inferomedial  portals were then established.  Diagnostic arthroscopy demonstrated  intact patellofemoral compartment and no loose bodies in the medial and  lateral gutter.  Synovitis was present in the suprapatellar pouch.  Lateral compartment showed lateral meniscal tear with intact chondral  cartilage.  The lateral meniscal tear was debrided back to a stable rim  with a combination of basket punch and shaver.  About 30% of the  meniscus was involved in the tear.  ACL and PCL were intact.  The  patient had significant grade 4 chondromalacia on both medial femoral  condyle and medial tibial plateau along with the unstable meniscal  tear.  The meniscal tear was debrided back to a stable rim with a combination  of basket punch and shaver.  Only about 50-60% of the anteroposterior  meniscus was involved in this tear.  Following debridement of the medial  and lateral  meniscal tear, joint was thoroughly irrigated and instruments were  removed.  Portals were closed using 3-0 nylon suture.  A solution of  Marcaine, morphine, and clonidine was injected into the knee.  The  patient tolerated the procedure well without immediate complication.  Bulky dressing was applied.      Burnard Bunting, M.D.  Electronically Signed     Burnard Bunting, M.D.  Electronically Signed    GSD/MEDQ  D:  11/04/2008  T:  11/04/2008  Job:  [id number]

## 2010-09-17 NOTE — Op Note (Signed)
[name], [name]                           ACCOUNT NO.:  [id number]   MEDICAL RECORD NO.:  [id number]                   PATIENT TYPE:  AMB   LOCATION:  [facility]                                 FACILITY:  [facility]   PHYSICIAN:  Mark C. Vernie Ammons, M.D.               DATE OF BIRTH:  1924-01-16   DATE OF PROCEDURE:  08/23/2002  DATE OF DISCHARGE:                                 OPERATIVE REPORT   PREOPERATIVE DIAGNOSIS:  Adenocarcinoma of the prostate.   POSTOPERATIVE DIAGNOSIS:  Adenocarcinoma of the prostate.   PROCEDURE:  I-125 seed implant.   SURGEON:  Mark C. Vernie Ammons, M.D.   RADIATION ONCOLOGIST:  Billie Lade, M.D.   ANESTHESIA:  General.   DRAINS:  16 French Foley catheter.   NUMBER OF NEEDLES:  26.   NUMBER OF SEEDS:  27 implanted, 25 remained with 2 removed.   ESTIMATED BLOOD LOSS:  Less than 5 cc.   COMPLICATIONS:  None.   INDICATIONS FOR PROCEDURE:  The patient has biopsy-proven adenocarcinoma of  the prostate.  His Gleason score is 6 in less than 5% of the biopsy specimen  with a PSA of 9.1.  He is brought to the OR today for radioactive seed  implant, understanding the risks, complications, and alternative.   DESCRIPTION OF OPERATION:  After informed consent, the patient brought to  the major OR and placed on the table and administered general anesthesia,  then moved to the modified lithotomy position.  The ultrasound unit was then  affixed to the table, placed in the rectum, and using real-time ultrasound  and fluoroscopy, the probe was placed in identical position to plan and the  planning ultrasound.  Then using ultrasound and fluoroscopy guidance, the  seeds were placed without difficulty.  I then removed the rectal probe and  the Foley catheter, performing a cystoscopy.  I noted a single strand  traversing the prostatic urethra.  This was grasped and extracted.  It was a  strand that only contained two seeds.  No further strands were noted in the  prostatic urethra or protruding into the bladder from the prostate base.  The remainder of the bladder was fully inspected and noted to be free of  tumor, stones, or inflammatory lesions.   The patient tolerated the procedure well.  There were no intraoperative  complications, and he will be discharged home with the Foley catheter  indwelling and a prescription for #30 Flomax to be taken nightly, #14, 500  mg Cipro b.i.d., and #6 Vicodin ES.  He will follow up in my office in three  weeks.                                               Mark C. Vernie Ammons, M.D.  MCO/MEDQ  D:  08/23/2002  T:  08/23/2002  Job:  [id number]

## 2012-01-16 ENCOUNTER — Other Ambulatory Visit (HOSPITAL_COMMUNITY): Payer: Self-pay | Admitting: Orthopedic Surgery

## 2012-01-16 DIAGNOSIS — M25562 Pain in left knee: Secondary | ICD-10-CM

## 2012-01-27 ENCOUNTER — Encounter (HOSPITAL_COMMUNITY): Payer: Self-pay

## 2012-01-27 ENCOUNTER — Encounter (HOSPITAL_COMMUNITY)
Admission: RE | Admit: 2012-01-27 | Discharge: 2012-01-27 | Disposition: A | Discharge status: Home / Self Care | Payer: Medicare Other | Source: Ambulatory Visit | Attending: Orthopedic Surgery | Admitting: Orthopedic Surgery

## 2012-01-27 DIAGNOSIS — M25569 Pain in unspecified knee: Secondary | ICD-10-CM | POA: Insufficient documentation

## 2012-01-27 DIAGNOSIS — Z96659 Presence of unspecified artificial knee joint: Secondary | ICD-10-CM | POA: Insufficient documentation

## 2012-01-27 DIAGNOSIS — M1710 Unilateral primary osteoarthritis, unspecified knee: Secondary | ICD-10-CM | POA: Insufficient documentation

## 2012-01-27 DIAGNOSIS — M25562 Pain in left knee: Secondary | ICD-10-CM

## 2012-01-27 DIAGNOSIS — M25469 Effusion, unspecified knee: Secondary | ICD-10-CM | POA: Insufficient documentation

## 2012-01-27 MED ORDER — TECHNETIUM TC 99M MEDRONATE IV KIT
23.7000 | PACK | Freq: Once | INTRAVENOUS | Status: AC | PRN
  Administered 2012-01-27: 23.7 via INTRAVENOUS

## 2012-05-28 IMAGING — CR DG CHEST 2V
2 series · 2 of 2 positions shown · non-contrast
Comparison: 02/18/2010

CLINICAL DATA: Cough and shortness of breath.

CHEST - 2 VIEW

[w chest pa]
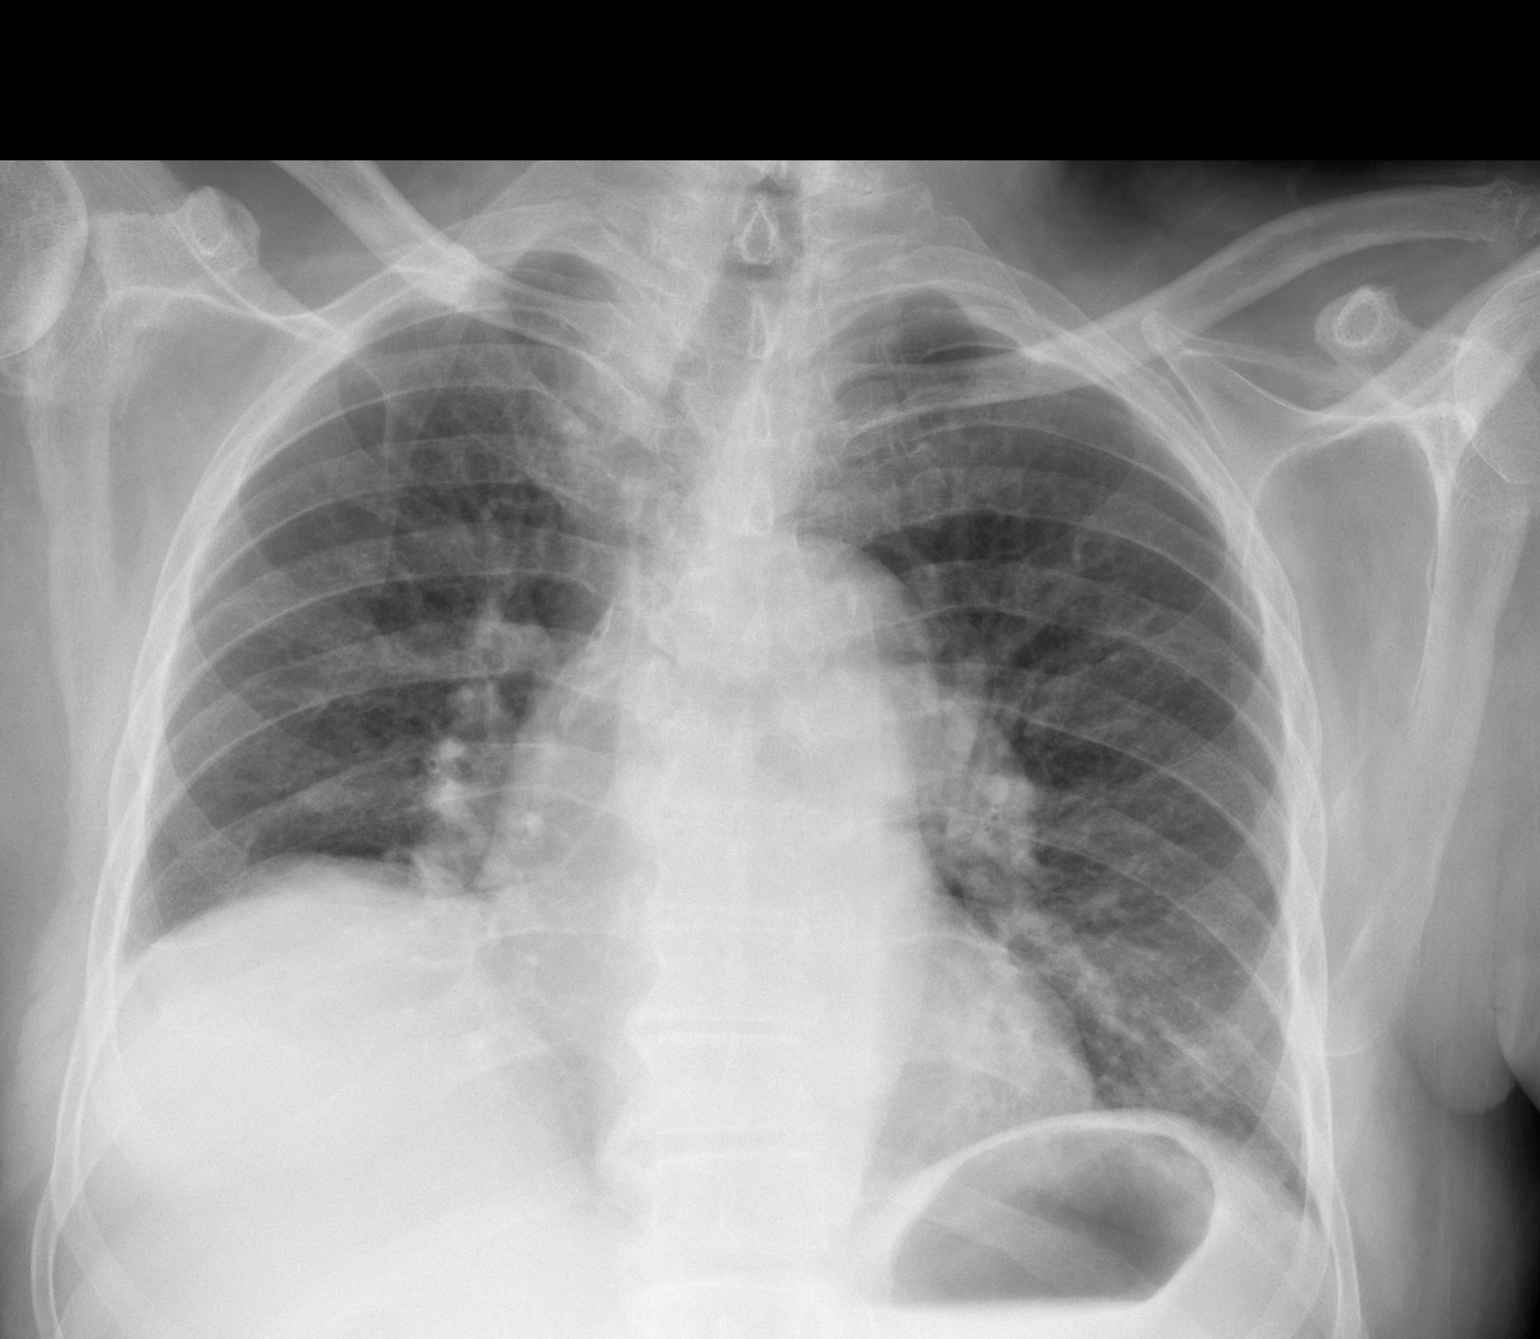

[w chest lat]
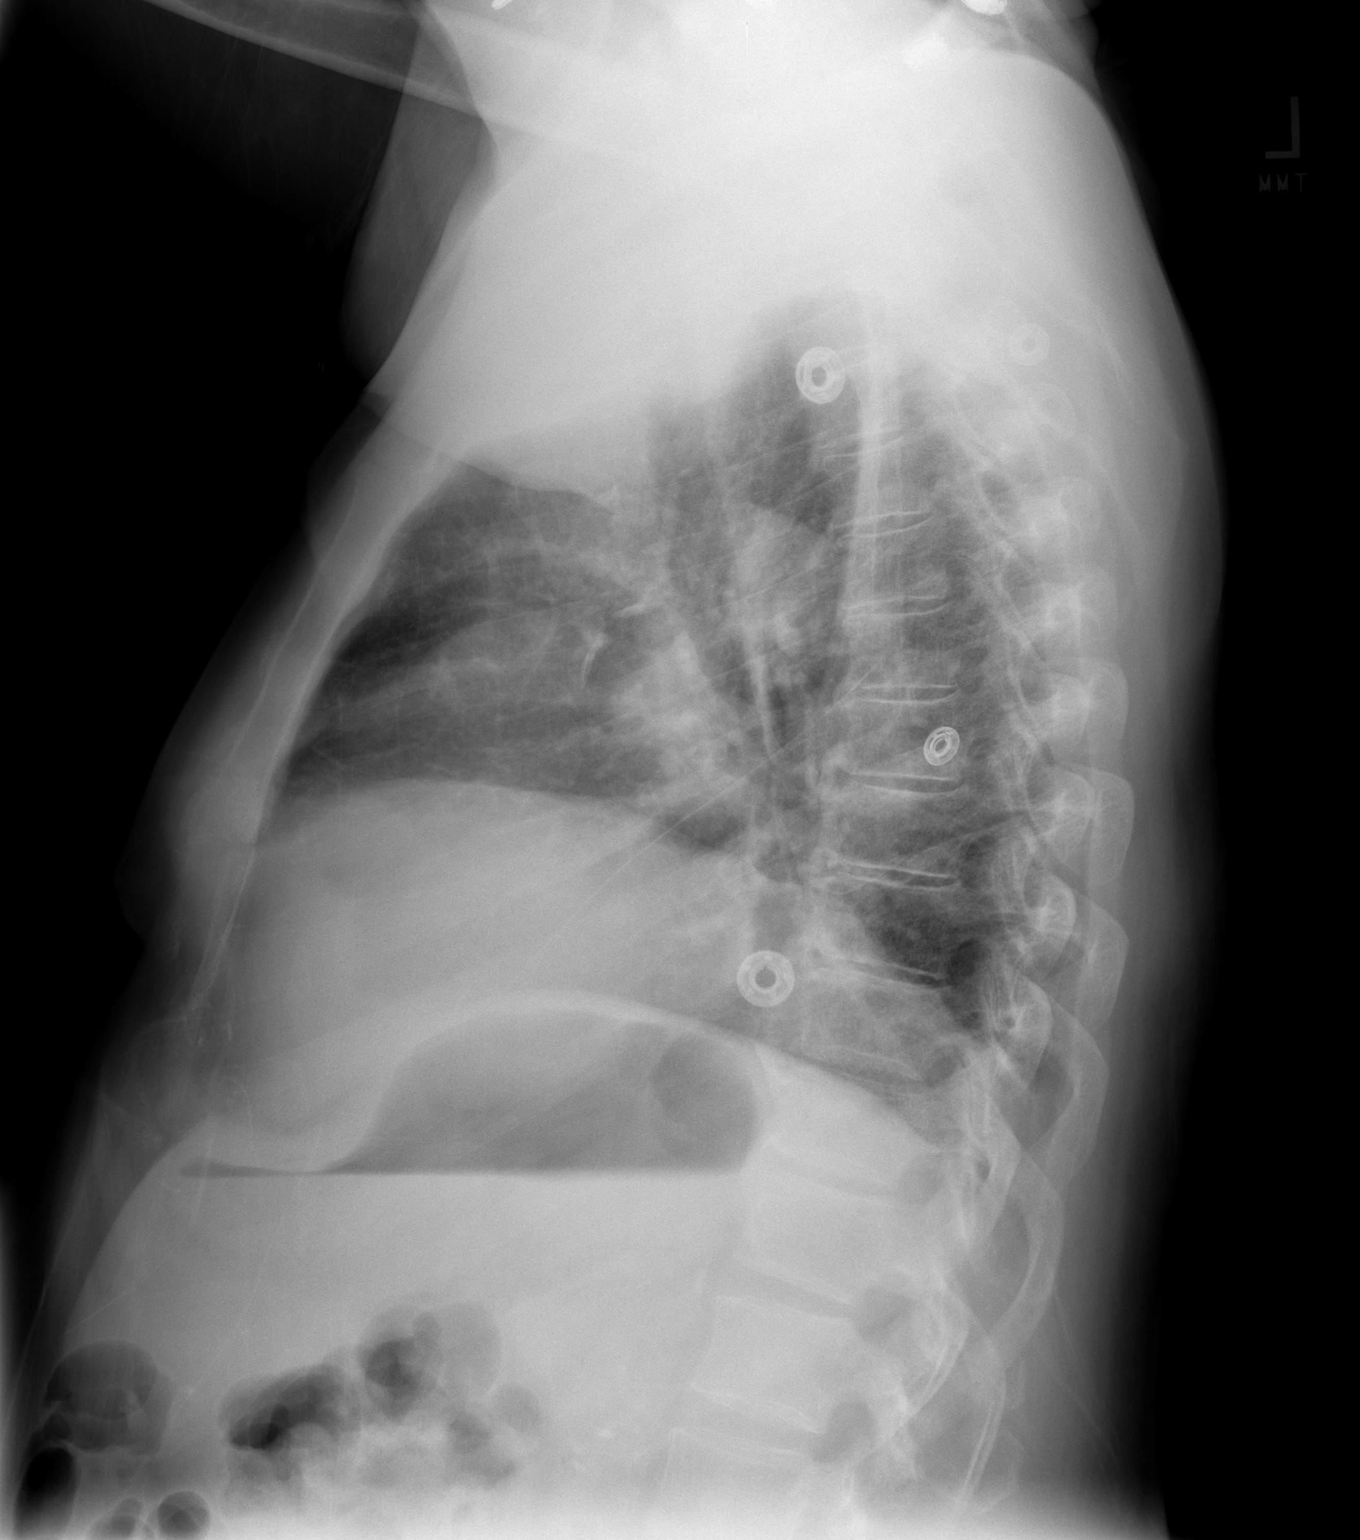

[2 of 2 positions shown; findings below may reference images not displayed]

FINDINGS: There is chronic elevation of the right hemidiaphragm.
There is slight prominence of the pulmonary vascularity is compared
to the prior exams but there is no discrete pulmonary edema or
effusion.  There are no infiltrates.  No significant osseous
abnormality.
IMPRESSION: Mild pulmonary vascular prominence, new.

## 2012-05-31 ENCOUNTER — Other Ambulatory Visit (HOSPITAL_COMMUNITY): Payer: Self-pay | Admitting: Orthopaedic Surgery

## 2012-05-31 ENCOUNTER — Other Ambulatory Visit (HOSPITAL_COMMUNITY): Payer: Self-pay

## 2012-06-01 ENCOUNTER — Other Ambulatory Visit (HOSPITAL_COMMUNITY): Payer: Self-pay | Admitting: Orthopaedic Surgery

## 2012-06-15 ENCOUNTER — Encounter (HOSPITAL_COMMUNITY): Payer: Self-pay | Admitting: Pharmacy Technician

## 2012-06-19 ENCOUNTER — Ambulatory Visit (HOSPITAL_COMMUNITY)
Admission: RE | Admit: 2012-06-19 | Discharge: 2012-06-19 | Disposition: A | Discharge status: Home / Self Care | Payer: Medicare Other | Source: Ambulatory Visit | Attending: Orthopaedic Surgery | Admitting: Orthopaedic Surgery

## 2012-06-19 ENCOUNTER — Encounter (HOSPITAL_COMMUNITY)
Admission: RE | Admit: 2012-06-19 | Discharge: 2012-06-19 | Disposition: A | Discharge status: Home / Self Care | Payer: Medicare Other | Source: Ambulatory Visit | Attending: Orthopaedic Surgery | Admitting: Orthopaedic Surgery

## 2012-06-19 ENCOUNTER — Encounter (HOSPITAL_COMMUNITY): Payer: Self-pay

## 2012-06-19 DIAGNOSIS — C801 Malignant (primary) neoplasm, unspecified: Secondary | ICD-10-CM

## 2012-06-19 DIAGNOSIS — I4892 Unspecified atrial flutter: Secondary | ICD-10-CM | POA: Insufficient documentation

## 2012-06-19 DIAGNOSIS — I499 Cardiac arrhythmia, unspecified: Secondary | ICD-10-CM

## 2012-06-19 DIAGNOSIS — Z01812 Encounter for preprocedural laboratory examination: Secondary | ICD-10-CM | POA: Insufficient documentation

## 2012-06-19 DIAGNOSIS — M1610 Unilateral primary osteoarthritis, unspecified hip: Secondary | ICD-10-CM | POA: Insufficient documentation

## 2012-06-19 DIAGNOSIS — Z01810 Encounter for preprocedural cardiovascular examination: Secondary | ICD-10-CM | POA: Insufficient documentation

## 2012-06-19 DIAGNOSIS — Z7901 Long term (current) use of anticoagulants: Secondary | ICD-10-CM | POA: Insufficient documentation

## 2012-06-19 DIAGNOSIS — M169 Osteoarthritis of hip, unspecified: Secondary | ICD-10-CM | POA: Insufficient documentation

## 2012-06-19 DIAGNOSIS — Z9289 Personal history of other medical treatment: Secondary | ICD-10-CM

## 2012-06-19 DIAGNOSIS — I10 Essential (primary) hypertension: Secondary | ICD-10-CM | POA: Insufficient documentation

## 2012-06-19 HISTORY — DX: Unspecified osteoarthritis, unspecified site: M19.90

## 2012-06-19 HISTORY — DX: Personal history of other medical treatment: Z92.89

## 2012-06-19 HISTORY — DX: Cardiac arrhythmia, unspecified: I49.9

## 2012-06-19 HISTORY — DX: Essential (primary) hypertension: I10

## 2012-06-19 HISTORY — PX: RADIOACTIVE SEED IMPLANT: SHX5150

## 2012-06-19 HISTORY — DX: Malignant (primary) neoplasm, unspecified: C80.1

## 2012-06-19 HISTORY — DX: Gout, unspecified: M10.9

## 2012-06-19 HISTORY — PX: JOINT REPLACEMENT: SHX530

## 2012-06-19 LAB — CBC
HCT: 31.5 % — ABNORMAL LOW (ref 39.0–52.0)
Hemoglobin: 10.6 g/dL — ABNORMAL LOW (ref 13.0–17.0)
MCV: 88.5 fL (ref 78.0–100.0)
WBC: 9.1 10*3/uL (ref 4.0–10.5)

## 2012-06-19 LAB — SURGICAL PCR SCREEN: Staphylococcus aureus: NEGATIVE

## 2012-06-19 LAB — PROTIME-INR
INR: 0.99 (ref 0.00–1.49)
Prothrombin Time: 13 seconds (ref 11.6–15.2)

## 2012-06-19 LAB — URINALYSIS, ROUTINE W REFLEX MICROSCOPIC
Glucose, UA: NEGATIVE mg/dL
Hgb urine dipstick: NEGATIVE
Specific Gravity, Urine: 1.013 (ref 1.005–1.030)

## 2012-06-19 LAB — ABO/RH: ABO/RH(D): O POS

## 2012-06-19 LAB — APTT: aPTT: 31 seconds (ref 24–37)

## 2012-06-19 LAB — BASIC METABOLIC PANEL
BUN: 37 mg/dL — ABNORMAL HIGH (ref 6–23)
CO2: 34 mEq/L — ABNORMAL HIGH (ref 19–32)
Chloride: 94 mEq/L — ABNORMAL LOW (ref 96–112)
GFR calc Af Amer: 37 mL/min — ABNORMAL LOW (ref >90)
Potassium: 4.1 mEq/L (ref 3.5–5.1)

## 2012-06-19 NOTE — Patient Instructions (Addendum)
20 [name]  06/19/2012   Your procedure is scheduled on:  2-28 -2014  Report to [facility] at 0700       AM.  Call this number if you have problems the morning of surgery: [phone]  Or Presurgical Testing [phone]([name])      Do not eat food:After Midnight.    Take these medicines the morning of surgery with A SIP OF WATER: Metoprolol. Tramadol. Acetaminophen. Stop Iron, Vitamins, Fish Oil. Do not take Furosemide Am of surgery -may take on other days.   Do not wear jewelry, make-up or nail polish.  Do not wear lotions, powders, or perfumes. You may wear deodorant.  Do not shave 12 hours prior to first CHG shower(legs and under arms).(face and neck okay.)  Do not bring valuables to the hospital.  Contacts, dentures or bridgework,body piercing,  may not be worn into surgery.  Leave suitcase in the car. After surgery it may be brought to your room.  For patients admitted to the hospital, checkout time is 11:00 AM the day of discharge.   Patients discharged the day of surgery will not be allowed to drive home. Must have responsible person with you x 24 hours once discharged.  Name and phone number of your driver: [phone] Daughter- [name] Special Instructions: CHG Shower Use Special Wash: see special instructions.(avoid face and genitals)   Please read over the following fact sheets that you were given: MRSA Information, Blood Transfusion fact sheet, Incentive Spirometry Instruction.    Failure to follow these instructions may result in Cancellation of your surgery.   Patient signature_______________________________________________________

## 2012-06-19 NOTE — Pre-Procedure Instructions (Addendum)
06-19-12 EKG/ CXR done today. 06-20-12 1610 Labs results faxed to Dr. Eliberto Ivory office. W. Kennon Portela

## 2012-06-20 NOTE — Progress Notes (Signed)
Note labs viewable in Epic done 06-19-12.

## 2012-06-29 ENCOUNTER — Inpatient Hospital Stay (HOSPITAL_COMMUNITY): Payer: Medicare Other | Admitting: Registered Nurse

## 2012-06-29 ENCOUNTER — Inpatient Hospital Stay (HOSPITAL_COMMUNITY): Payer: Medicare Other

## 2012-06-29 ENCOUNTER — Inpatient Hospital Stay (HOSPITAL_COMMUNITY)
Admission: RE | Admit: 2012-06-29 | Discharge: 2012-07-03 | DRG: 470 | Disposition: A | Discharge status: Skilled Nursing Facility | Payer: Medicare Other | Source: Ambulatory Visit | Attending: Orthopaedic Surgery | Admitting: Orthopaedic Surgery

## 2012-06-29 ENCOUNTER — Encounter (HOSPITAL_COMMUNITY): Payer: Self-pay | Admitting: *Deleted

## 2012-06-29 ENCOUNTER — Encounter (HOSPITAL_COMMUNITY)
Admission: RE | Disposition: A | Discharge status: Skilled Nursing Facility | Payer: Self-pay | Source: Ambulatory Visit | Attending: Orthopaedic Surgery

## 2012-06-29 ENCOUNTER — Encounter (HOSPITAL_COMMUNITY): Payer: Self-pay | Admitting: Registered Nurse

## 2012-06-29 DIAGNOSIS — M1610 Unilateral primary osteoarthritis, unspecified hip: Principal | ICD-10-CM | POA: Diagnosis present

## 2012-06-29 DIAGNOSIS — Z96659 Presence of unspecified artificial knee joint: Secondary | ICD-10-CM

## 2012-06-29 DIAGNOSIS — D62 Acute posthemorrhagic anemia: Secondary | ICD-10-CM | POA: Diagnosis not present

## 2012-06-29 DIAGNOSIS — I4891 Unspecified atrial fibrillation: Secondary | ICD-10-CM | POA: Diagnosis present

## 2012-06-29 DIAGNOSIS — I10 Essential (primary) hypertension: Secondary | ICD-10-CM | POA: Diagnosis present

## 2012-06-29 DIAGNOSIS — M169 Osteoarthritis of hip, unspecified: Principal | ICD-10-CM

## 2012-06-29 HISTORY — PX: TOTAL HIP ARTHROPLASTY: SHX124

## 2012-06-29 SURGERY — ARTHROPLASTY, HIP, TOTAL, ANTERIOR APPROACH
Anesthesia: Spinal | Site: Hip | Laterality: Left | Wound class: Clean

## 2012-06-29 MED ORDER — MENTHOL 3 MG MT LOZG: 1.0000 | LOZENGE | OROMUCOSAL | Status: DC | PRN

## 2012-06-29 MED ORDER — METHOCARBAMOL 500 MG PO TABS
500.0000 mg | ORAL_TABLET | Freq: Four times a day (QID) | ORAL | Status: DC | PRN
  Administered 2012-06-29 – 2012-07-02 (×6): 500 mg via ORAL
  Filled 2012-06-29 (×5): qty 1

## 2012-06-29 MED ORDER — SODIUM CHLORIDE 0.9 % IV SOLN
INTRAVENOUS | Status: DC
  Administered 2012-06-29 – 2012-06-30 (×2): via INTRAVENOUS

## 2012-06-29 MED ORDER — PHENYLEPHRINE HCL 10 MG/ML IJ SOLN
INTRAMUSCULAR | Status: DC | PRN
  Administered 2012-06-29 (×2): 80 ug via INTRAVENOUS
  Administered 2012-06-29: 40 ug via INTRAVENOUS
  Administered 2012-06-29: 80 ug via INTRAVENOUS
  Administered 2012-06-29: 40 ug via INTRAVENOUS

## 2012-06-29 MED ORDER — DIPHENHYDRAMINE HCL 12.5 MG/5ML PO ELIX
12.5000 mg | ORAL_SOLUTION | ORAL | Status: DC | PRN
  Administered 2012-07-01: 25 mg via ORAL
  Administered 2012-07-01: 12.5 mg via ORAL
  Filled 2012-06-29: qty 5
  Filled 2012-06-29: qty 10

## 2012-06-29 MED ORDER — HYDROMORPHONE HCL PF 1 MG/ML IJ SOLN
1.0000 mg | INTRAMUSCULAR | Status: DC | PRN
  Administered 2012-06-29: 1 mg via INTRAVENOUS
  Filled 2012-06-29: qty 1

## 2012-06-29 MED ORDER — ACETAMINOPHEN 325 MG PO TABS
650.0000 mg | ORAL_TABLET | Freq: Four times a day (QID) | ORAL | Status: DC | PRN
  Administered 2012-07-01 – 2012-07-03 (×3): 650 mg via ORAL
  Filled 2012-06-29 (×3): qty 2

## 2012-06-29 MED ORDER — RIVAROXABAN 10 MG PO TABS
10.0000 mg | ORAL_TABLET | Freq: Every day | ORAL | Status: DC
  Administered 2012-06-30 – 2012-07-02 (×3): 10 mg via ORAL
  Filled 2012-06-29 (×4): qty 1

## 2012-06-29 MED ORDER — PHENOL 1.4 % MT LIQD: 1.0000 | OROMUCOSAL | Status: DC | PRN

## 2012-06-29 MED ORDER — ACETAMINOPHEN 650 MG RE SUPP: 650.0000 mg | Freq: Four times a day (QID) | RECTAL | Status: DC | PRN

## 2012-06-29 MED ORDER — METOCLOPRAMIDE HCL 10 MG PO TABS: 5.0000 mg | ORAL_TABLET | Freq: Three times a day (TID) | ORAL | Status: DC | PRN

## 2012-06-29 MED ORDER — PROPOFOL INFUSION 10 MG/ML OPTIME
INTRAVENOUS | Status: DC | PRN
  Administered 2012-06-29: 50 ug/kg/min via INTRAVENOUS

## 2012-06-29 MED ORDER — ACETAMINOPHEN 10 MG/ML IV SOLN
INTRAVENOUS | Status: DC | PRN
  Administered 2012-06-29: 1000 mg via INTRAVENOUS

## 2012-06-29 MED ORDER — ALLOPURINOL 100 MG PO TABS
100.0000 mg | ORAL_TABLET | Freq: Every day | ORAL | Status: DC
Start: 2012-06-29 — End: 2012-07-03
  Administered 2012-06-29 – 2012-07-02 (×4): 100 mg via ORAL
  Filled 2012-06-29 (×5): qty 1

## 2012-06-29 MED ORDER — OXYCODONE HCL ER 10 MG PO T12A: 10.0000 mg | EXTENDED_RELEASE_TABLET | Freq: Two times a day (BID) | ORAL | Status: DC

## 2012-06-29 MED ORDER — FUROSEMIDE 40 MG PO TABS
40.0000 mg | ORAL_TABLET | Freq: Every morning | ORAL | Status: DC
Start: 2012-06-29 — End: 2012-07-03
  Administered 2012-06-29 – 2012-07-03 (×5): 40 mg via ORAL
  Filled 2012-06-29 (×5): qty 1

## 2012-06-29 MED ORDER — FENTANYL CITRATE 0.05 MG/ML IJ SOLN
INTRAMUSCULAR | Status: DC | PRN
  Administered 2012-06-29 (×2): 25 ug via INTRAVENOUS

## 2012-06-29 MED ORDER — 0.9 % SODIUM CHLORIDE (POUR BTL) OPTIME
TOPICAL | Status: DC | PRN
  Administered 2012-06-29: 1000 mL

## 2012-06-29 MED ORDER — LIDOCAINE HCL (CARDIAC) 20 MG/ML IV SOLN
INTRAVENOUS | Status: DC | PRN
  Administered 2012-06-29: 100 mg via INTRAVENOUS

## 2012-06-29 MED ORDER — OXYCODONE HCL 5 MG PO TABS
5.0000 mg | ORAL_TABLET | ORAL | Status: DC | PRN
  Administered 2012-06-29: 5 mg via ORAL
  Administered 2012-06-30 – 2012-07-01 (×6): 10 mg via ORAL
  Administered 2012-07-01 – 2012-07-02 (×3): 5 mg via ORAL
  Filled 2012-06-29 (×3): qty 2
  Filled 2012-06-29 (×3): qty 1
  Filled 2012-06-29 (×3): qty 2
  Filled 2012-06-29: qty 1

## 2012-06-29 MED ORDER — EPHEDRINE SULFATE 50 MG/ML IJ SOLN
INTRAMUSCULAR | Status: DC | PRN
  Administered 2012-06-29: 10 mg via INTRAVENOUS
  Administered 2012-06-29: 5 mg via INTRAVENOUS
  Administered 2012-06-29: 10 mg via INTRAVENOUS

## 2012-06-29 MED ORDER — IRBESARTAN 300 MG PO TABS
300.0000 mg | ORAL_TABLET | Freq: Every day | ORAL | Status: DC
  Administered 2012-06-29 – 2012-07-03 (×5): 300 mg via ORAL
  Filled 2012-06-29 (×5): qty 1

## 2012-06-29 MED ORDER — METOCLOPRAMIDE HCL 5 MG/ML IJ SOLN: 5.0000 mg | Freq: Three times a day (TID) | INTRAMUSCULAR | Status: DC | PRN

## 2012-06-29 MED ORDER — OXYCODONE HCL ER 10 MG PO T12A
10.0000 mg | EXTENDED_RELEASE_TABLET | Freq: Two times a day (BID) | ORAL | Status: DC
  Administered 2012-06-29 – 2012-07-02 (×6): 10 mg via ORAL
  Filled 2012-06-29 (×6): qty 1

## 2012-06-29 MED ORDER — SODIUM CHLORIDE 0.9 % IV SOLN
INTRAVENOUS | Status: DC | PRN
  Administered 2012-06-29: 1000 mL

## 2012-06-29 MED ORDER — ONDANSETRON HCL 4 MG/2ML IJ SOLN
INTRAMUSCULAR | Status: DC | PRN
  Administered 2012-06-29: 4 mg via INTRAVENOUS

## 2012-06-29 MED ORDER — METHOCARBAMOL 100 MG/ML IJ SOLN: 500.0000 mg | Freq: Four times a day (QID) | INTRAVENOUS | Status: DC | PRN

## 2012-06-29 MED ORDER — LACTATED RINGERS IV SOLN
INTRAVENOUS | Status: DC | PRN
  Administered 2012-06-29 (×2): via INTRAVENOUS

## 2012-06-29 MED ORDER — SIMVASTATIN 10 MG PO TABS
10.0000 mg | ORAL_TABLET | Freq: Every day | ORAL | Status: DC
  Administered 2012-06-30 – 2012-07-02 (×3): 10 mg via ORAL
  Filled 2012-06-29 (×5): qty 1

## 2012-06-29 MED ORDER — DOCUSATE SODIUM 100 MG PO CAPS
100.0000 mg | ORAL_CAPSULE | Freq: Two times a day (BID) | ORAL | Status: DC
  Administered 2012-06-30 – 2012-07-03 (×7): 100 mg via ORAL

## 2012-06-29 MED ORDER — FERROUS SULFATE 325 (65 FE) MG PO TABS
325.0000 mg | ORAL_TABLET | Freq: Three times a day (TID) | ORAL | Status: DC
Start: 2012-06-29 — End: 2012-07-03
  Administered 2012-06-29 – 2012-07-03 (×11): 325 mg via ORAL
  Filled 2012-06-29 (×14): qty 1

## 2012-06-29 MED ORDER — CEFAZOLIN SODIUM 1-5 GM-% IV SOLN
1.0000 g | Freq: Four times a day (QID) | INTRAVENOUS | Status: AC
Start: 2012-06-29 — End: 2012-06-29
  Administered 2012-06-29 (×2): 1 g via INTRAVENOUS
  Filled 2012-06-29 (×2): qty 50

## 2012-06-29 MED ORDER — METOPROLOL TARTRATE 25 MG PO TABS
25.0000 mg | ORAL_TABLET | Freq: Two times a day (BID) | ORAL | Status: DC
  Administered 2012-06-30 – 2012-07-03 (×8): 25 mg via ORAL
  Filled 2012-06-29 (×9): qty 1

## 2012-06-29 MED ORDER — ADULT MULTIVITAMIN W/MINERALS CH
1.0000 | ORAL_TABLET | Freq: Every morning | ORAL | Status: DC
  Administered 2012-06-29 – 2012-07-03 (×5): 1 via ORAL
  Filled 2012-06-29 (×5): qty 1

## 2012-06-29 MED ORDER — ONDANSETRON HCL 4 MG PO TABS: 4.0000 mg | ORAL_TABLET | Freq: Four times a day (QID) | ORAL | Status: DC | PRN

## 2012-06-29 MED ORDER — PHENYLEPHRINE HCL 10 MG/ML IJ SOLN
20.0000 mg | INTRAVENOUS | Status: DC | PRN
  Administered 2012-06-29: 25 ug/min via INTRAVENOUS

## 2012-06-29 MED ORDER — TAMSULOSIN HCL 0.4 MG PO CAPS
0.4000 mg | ORAL_CAPSULE | Freq: Every day | ORAL | Status: DC
  Administered 2012-06-29 – 2012-07-02 (×4): 0.4 mg via ORAL
  Filled 2012-06-29 (×5): qty 1

## 2012-06-29 MED ORDER — HETASTARCH-ELECTROLYTES 6 % IV SOLN
INTRAVENOUS | Status: DC | PRN
  Administered 2012-06-29: 11:00:00 via INTRAVENOUS

## 2012-06-29 MED ORDER — STERILE WATER FOR IRRIGATION IR SOLN
Status: DC | PRN
  Administered 2012-06-29: 1500 mL

## 2012-06-29 MED ORDER — BUPIVACAINE HCL (PF) 0.5 % IJ SOLN
INTRAMUSCULAR | Status: DC | PRN
  Administered 2012-06-29: 3 mL

## 2012-06-29 MED ORDER — ONDANSETRON HCL 4 MG/2ML IJ SOLN: 4.0000 mg | Freq: Four times a day (QID) | INTRAMUSCULAR | Status: DC | PRN

## 2012-06-29 MED ORDER — FENTANYL CITRATE 0.05 MG/ML IJ SOLN: 25.0000 ug | INTRAMUSCULAR | Status: DC | PRN

## 2012-06-29 MED ORDER — ZOLPIDEM TARTRATE 5 MG PO TABS
5.0000 mg | ORAL_TABLET | Freq: Every evening | ORAL | Status: DC | PRN
  Administered 2012-06-29: 5 mg via ORAL
  Filled 2012-06-29: qty 1

## 2012-06-29 MED ORDER — CEFAZOLIN SODIUM-DEXTROSE 2-3 GM-% IV SOLR
2.0000 g | INTRAVENOUS | Status: AC
  Administered 2012-06-29: 2 g via INTRAVENOUS

## 2012-06-29 SURGICAL SUPPLY — 41 items
ADH SKN CLS APL DERMABOND .7 (GAUZE/BANDAGES/DRESSINGS) ×1
BAG SPEC THK2 15X12 ZIP CLS (MISCELLANEOUS) ×2
BAG ZIPLOCK 12X15 (MISCELLANEOUS) ×4 IMPLANT
BLADE SAW SGTL 18X1.27X75 (BLADE) ×2 IMPLANT
CELLS DAT CNTRL 66122 CELL SVR (MISCELLANEOUS) ×1 IMPLANT
CLOTH BEACON ORANGE TIMEOUT ST (SAFETY) ×2 IMPLANT
DERMABOND ADVANCED (GAUZE/BANDAGES/DRESSINGS) ×1
DERMABOND ADVANCED .7 DNX12 (GAUZE/BANDAGES/DRESSINGS) ×1 IMPLANT
DRAPE C-ARM 42X72 X-RAY (DRAPES) ×2 IMPLANT
DRAPE STERI IOBAN 125X83 (DRAPES) ×2 IMPLANT
DRAPE U-SHAPE 47X51 STRL (DRAPES) ×6 IMPLANT
DRSG AQUACEL AG ADV 3.5X10 (GAUZE/BANDAGES/DRESSINGS) ×2 IMPLANT
DURAPREP 26ML APPLICATOR (WOUND CARE) ×2 IMPLANT
ELECT BLADE TIP CTD 4 INCH (ELECTRODE) ×2 IMPLANT
ELECT REM PT RETURN 9FT ADLT (ELECTROSURGICAL) ×2
ELECTRODE REM PT RTRN 9FT ADLT (ELECTROSURGICAL) ×1 IMPLANT
FACESHIELD LNG OPTICON STERILE (SAFETY) ×8 IMPLANT
GLOVE BIO SURGEON STRL SZ7 (GLOVE) ×2 IMPLANT
GLOVE BIO SURGEON STRL SZ7.5 (GLOVE) ×2 IMPLANT
GLOVE BIOGEL PI IND STRL 7.5 (GLOVE) IMPLANT
GLOVE BIOGEL PI IND STRL 8 (GLOVE) ×1 IMPLANT
GLOVE BIOGEL PI INDICATOR 7.5 (GLOVE)
GLOVE BIOGEL PI INDICATOR 8 (GLOVE) ×1
GLOVE ECLIPSE 7.0 STRL STRAW (GLOVE) ×2 IMPLANT
GOWN STRL REIN XL XLG (GOWN DISPOSABLE) ×4 IMPLANT
HANDPIECE INTERPULSE COAX TIP (DISPOSABLE) ×2
KIT BASIN OR (CUSTOM PROCEDURE TRAY) ×2 IMPLANT
PACK TOTAL JOINT (CUSTOM PROCEDURE TRAY) ×2 IMPLANT
PADDING CAST COTTON 6X4 STRL (CAST SUPPLIES) ×2 IMPLANT
RETRACTOR WND ALEXIS 18 MED (MISCELLANEOUS) ×1 IMPLANT
RTRCTR WOUND ALEXIS 18CM MED (MISCELLANEOUS) ×2
SET HNDPC FAN SPRY TIP SCT (DISPOSABLE) ×1 IMPLANT
SUT ETHIBOND NAB CT1 #1 30IN (SUTURE) ×4 IMPLANT
SUT MNCRL AB 4-0 PS2 18 (SUTURE) ×2 IMPLANT
SUT VIC AB 1 CT1 36 (SUTURE) ×4 IMPLANT
SUT VIC AB 2-0 CT1 27 (SUTURE) ×4
SUT VIC AB 2-0 CT1 TAPERPNT 27 (SUTURE) ×2 IMPLANT
SUT VLOC 180 0 24IN GS25 (SUTURE) ×2 IMPLANT
TOWEL OR 17X26 10 PK STRL BLUE (TOWEL DISPOSABLE) ×3 IMPLANT
TOWEL OR NON WOVEN STRL DISP B (DISPOSABLE) ×2 IMPLANT
TRAY FOLEY CATH 14FRSI W/METER (CATHETERS) ×2 IMPLANT

## 2012-06-29 NOTE — Transfer of Care (Signed)
Immediate Anesthesia Transfer of Care Note  Patient: [facility]  Procedure(s) Performed: Procedure(s) with comments: LEFT TOTAL HIP ARTHROPLASTY ANTERIOR APPROACH (Left) - Left Total Hip Arthroplasty, Anterior Approach  Patient Location: PACU  Anesthesia Type:MAC and Spinal  Level of Consciousness: awake, alert , oriented and patient cooperative  Airway & Oxygen Therapy: Patient Spontanous Breathing and Patient connected to face mask oxygen  Post-op Assessment: Report given to PACU RN and Post -op Vital signs reviewed and stable  Post vital signs: Reviewed and stable  Complications: No apparent anesthesia complications

## 2012-06-29 NOTE — Preoperative (Signed)
Beta Blockers   Reason not to administer Beta Blockers:lopressor at 0630 on 06-29-12

## 2012-06-29 NOTE — Progress Notes (Signed)
Clinical Social Work Department CLINICAL SOCIAL WORK PLACEMENT NOTE 06/29/2012  Patient:  [name],[name]  Account Number:  [id number] Admit date:  06/29/2012  Clinical Social Worker:  Cori Razor, LCSW  Date/time:  06/29/2012 03:50 PM  Clinical Social Work is seeking post-discharge placement for this patient at the following level of care:   SKILLED NURSING   (*CSW will update this form in Epic as items are completed)   06/29/2012  Patient/family provided with [facility] Department of Clinical Social Work's list of facilities offering this level of care within the geographic area requested by the patient (or if unable, by the patient's family).  06/29/2012  Patient/family informed of their freedom to choose among providers that offer the needed level of care, that participate in Medicare, Medicaid or managed care program needed by the patient, have an available bed and are willing to accept the patient.    Patient/family informed of [facility]' ownership interest in [facility], as well as of the fact that they are under no obligation to receive care at this facility.  PASARR submitted to EDS on 06/29/2012 PASARR number received from EDS on 06/29/2012  FL2 transmitted to all facilities in geographic area requested by pt/family on  06/29/2012 FL2 transmitted to all facilities within larger geographic area on   Patient informed that his/her managed care company has contracts with or will negotiate with  certain facilities, including the following:     Patient/family informed of bed offers received:   Patient chooses bed at  Physician recommends and patient chooses bed at    Patient to be transferred to  on   Patient to be transferred to facility by   The following physician request were entered in Epic:   Additional Comments:  Cori Razor LCSW [phone]

## 2012-06-29 NOTE — Progress Notes (Signed)
Utilization review completed.  

## 2012-06-29 NOTE — Anesthesia Postprocedure Evaluation (Signed)
  Anesthesia Post-op Note  Patient: [name]  Procedure(s) Performed: Procedure(s) (LRB): LEFT TOTAL HIP ARTHROPLASTY ANTERIOR APPROACH (Left)  Patient Location: PACU  Anesthesia Type: Spinal  Level of Consciousness: awake and alert   Airway and Oxygen Therapy: Patient Spontanous Breathing  Post-op Pain: mild  Post-op Assessment: Post-op Vital signs reviewed, Patient's Cardiovascular Status Stable, Respiratory Function Stable, Patent Airway and No signs of Nausea or vomiting  Last Vitals:  Filed Vitals:   06/29/12 1400  BP: 111/63  Pulse: 58  Temp: 36.4 C  Resp: 16    Post-op Vital Signs: stable   Complications: No apparent anesthesia complications. Moving both feet.

## 2012-06-29 NOTE — H&P (Signed)
TOTAL HIP ADMISSION H&P  Patient is admitted for left total hip arthroplasty.  Subjective:  Chief Complaint: left hip pain  HPI: [facility], 79 y.o. male, has a history of pain and functional disability in the left hip(s) due to arthritis and patient has failed non-surgical conservative treatments for greater than 12 weeks to include NSAID's and/or analgesics, use of assistive devices and activity modification.  Onset of symptoms was gradual starting 5 years ago with gradually worsening course since that time.The patient noted no past surgery on the left hip(s).  Patient currently rates pain in the left hip at 8 out of 10 with activity. Patient has night pain, worsening of pain with activity and weight bearing, trendelenberg gait, pain that interfers with activities of daily living, pain with passive range of motion and crepitus. Patient has evidence of subchondral cysts, subchondral sclerosis, periarticular osteophytes and joint space narrowing by imaging studies. This condition presents safety issues increasing the risk of falls.  There is no current active infection.  Patient Active Problem List   Diagnosis Date Noted  . Degenerative arthritis of hip 06/29/2012  . TOTAL KNEE REPLACEMENT, LEFT, HX OF 04/12/2010  . RBBB 02/18/2010  . NONSPECIFIC ABNORMAL UNSPEC CV FUNCTION STUDY 02/18/2010  . ATRIAL FLUTTER 02/10/2010  . GOUT 02/09/2010  . HYPERTENSION 02/09/2010   Past Medical History  Diagnosis Date  . History of knee replacement, total 06-19-12    2011  . Hypertension   . Cancer 06-19-12    Hx. 15 yrs ago -seed implant-PSA remains elevated somewhat.  . Arthritis     Osteoarthritis(right shoulder)-knees, hip . s/p LTKA  . Transfusion history 06-19-12    history s/p '11- LTKA  . Gout   . Dysrhythmia 06-19-12    medical record note shows hx R)BBB-. A.Fib/ flutter-not present    Past Surgical History  Procedure Laterality Date  . Joint replacement  06-19-12    '11 -LTKA  .  Cataract extraction, bilateral    . Minor hemorrhoidectomy    . Radioactive seed implant  06-19-12    many yrs ago > 15 yrs    Prescriptions prior to admission  Medication Sig Dispense Refill  . furosemide (LASIX) 40 MG tablet Take 40 mg by mouth every morning.      . metoprolol tartrate (LOPRESSOR) 25 MG tablet Take 25 mg by mouth 2 (two) times daily.      . traMADol (ULTRAM) 50 MG tablet Take 50 mg by mouth at bedtime as needed for pain.      Marland Kitchen acetaminophen (TYLENOL) 500 MG tablet Take 500 mg by mouth every 6 (six) hours as needed (For knee pain.).      Marland Kitchen allopurinol (ZYLOPRIM) 100 MG tablet Take 100 mg by mouth at bedtime.      . Ferrous Sulfate (IRON) 325 (65 FE) MG TABS Take 325 mg by mouth every morning.      . Multiple Vitamin (MULTIVITAMIN WITH MINERALS) TABS Take 1 tablet by mouth every morning.      . olmesartan (BENICAR) 40 MG tablet Take 40 mg by mouth at bedtime.      . Omega-3 Fatty Acids (FISH OIL) 1200 MG CAPS Take 1,200 mg by mouth at bedtime. Includes 360mg  of Omega-3.      . pravastatin (PRAVACHOL) 20 MG tablet Take 20 mg by mouth at bedtime.      . Tamsulosin HCl (FLOMAX) 0.4 MG CAPS Take 0.4 mg by mouth at bedtime.       Allergies  Allergen Reactions  . Aspirin Other (See Comments)    bleeding    History  Substance Use Topics  . Smoking status: Former Smoker    Types: Cigarettes    Quit date: 06/19/1980  . Smokeless tobacco: Not on file  . Alcohol Use: 1.8 oz/week    3 Shots of liquor per week     Comment: 10 -12 ounces daily    History reviewed. No pertinent family history.   Review of Systems  Musculoskeletal: Positive for joint pain.  All other systems reviewed and are negative.    Objective:  Physical Exam  Constitutional: He is oriented to person, place, and time. He appears well-developed and well-nourished.  HENT:  Head: Normocephalic and atraumatic.  Eyes: EOM are normal. Pupils are equal, round, and reactive to light.  Neck: Normal range  of motion. Neck supple.  Cardiovascular: Normal rate and regular rhythm.   Respiratory: Effort normal and breath sounds normal.  GI: Soft. Bowel sounds are normal.  Musculoskeletal:       Left hip: He exhibits decreased range of motion, tenderness and crepitus.  Neurological: He is alert and oriented to person, place, and time.  Skin: Skin is warm and dry.  Psychiatric: He has a normal mood and affect.    Vital signs in last 24 hours: Temp:  [97.9 F (36.6 C)] 97.9 F (36.6 C) (02/28 0700) Pulse Rate:  [64] 64 (02/28 0700) Resp:  [20] 20 (02/28 0700) BP: (165)/(71) 165/71 mmHg (02/28 0700) SpO2:  [90 %] 90 % (02/28 0700)  Labs:   Estimated body mass index is 32.78 kg/(m^2) as calculated from the following:   Height as of 06/19/12: 5\' 5"  (1.651 m).   Weight as of 04/12/10: 89.359 kg (197 lb).   Imaging Review Plain radiographs demonstrate severe degenerative joint disease of the left hip(s). The bone quality appears to be good for age and reported activity level.  Assessment/Plan:  End stage arthritis, left hip(s)  The patient history, physical examination, clinical judgement of the provider and imaging studies are consistent with end stage degenerative joint disease of the left hip(s) and total hip arthroplasty is deemed medically necessary. The treatment options including medical management, injection therapy, arthroscopy and arthroplasty were discussed at length. The risks and benefits of total hip arthroplasty were presented and reviewed. The risks due to aseptic loosening, infection, stiffness, dislocation/subluxation,  thromboembolic complications and other imponderables were discussed.  The patient acknowledged the explanation, agreed to proceed with the plan and consent was signed. Patient is being admitted for inpatient treatment for surgery, pain control, PT, OT, prophylactic antibiotics, VTE prophylaxis, progressive ambulation and ADL's and discharge planning.The patient  is planning to be discharged home with home health services

## 2012-06-29 NOTE — Progress Notes (Signed)
Clinical Social Work Department BRIEF PSYCHOSOCIAL ASSESSMENT 06/29/2012  Patient:  [name],[name]     Account Number:  [id number]     Admit date:  06/29/2012  Clinical Social Worker:  Candie Chroman  Date/Time:  06/29/2012 03:39 PM  Referred by:  Physician  Date Referred:  06/29/2012 Referred for  SNF Placement   Other Referral:   Interview type:  Patient Other interview type:    PSYCHOSOCIAL DATA Living Status:  ALONE Admitted from facility:   Level of care:   Primary support name:  Claudette Laws Primary support relationship to patient:  CHILD, ADULT Degree of support available:   supportive [place] of [place]    CURRENT CONCERNS Current Concerns  Post-Acute Placement   Other Concerns:    SOCIAL WORK ASSESSMENT / PLAN Pt is an 79 yr old gentleman living at home prior to hospitalization. CSW met with pt / family to assist with d/c planning. Pt would like to have rehab at [facility] following hospital d/c. CSW has contacted SNF and was told that there should be an opening for this pt when ready for d/c. CSW has intiated SNF search and will provide bed offers if needed.   Assessment/plan status:  Psychosocial Support/Ongoing Assessment of Needs Other assessment/ plan:   Information/referral to community resources:   SNF list provided.    PATIENT'S/FAMILY'S RESPONSE TO PLAN OF CARE: Pt would like to have his rehab at [facility].   Cori Razor LCSW [phone]

## 2012-06-29 NOTE — Anesthesia Procedure Notes (Signed)
Spinal  Patient location during procedure: OR Start time: 06/29/2012 10:04 AM Staffing Anesthesiologist: Azell Der Performed by: anesthesiologist  Preanesthetic Checklist Completed: patient identified, site marked, surgical consent, pre-op evaluation, timeout performed, IV checked, risks and benefits discussed and monitors and equipment checked Spinal Block Patient position: sitting Prep: Betadine Patient monitoring: heart rate, continuous pulse ox and blood pressure Approach: midline Location: L3-4 Injection technique: single-shot Needle Needle type: Spinocan  Needle gauge: 22 G Needle length: 9 cm Additional Notes Expiration date of kit checked and confirmed. Patient tolerated procedure well, without complications. No paresthesia.

## 2012-06-29 NOTE — Anesthesia Preprocedure Evaluation (Addendum)
Anesthesia Evaluation  Patient identified by MRN, date of birth, ID band Patient awake    Reviewed: Allergy & Precautions, H&P , NPO status , Patient's Chart, lab work & pertinent test results  Airway Mallampati: II TM Distance: >3 FB Neck ROM: Full    Dental no notable dental hx.    Pulmonary neg pulmonary ROS,  breath sounds clear to auscultation  Pulmonary exam normal       Cardiovascular hypertension, Pt. on medications and Pt. on home beta blockers + dysrhythmias Atrial Fibrillation Rhythm:Regular Rate:Normal  Has never been on coumadin. ECG shows RBBB and NSR  CXR clear   Neuro/Psych negative neurological ROS  negative psych ROS   GI/Hepatic negative GI ROS, Neg liver ROS,   Endo/Other  negative endocrine ROS  Renal/GU Cr 1.81  negative genitourinary   Musculoskeletal negative musculoskeletal ROS (+)   Abdominal   Peds negative pediatric ROS (+)  Hematology negative hematology ROS (+)   Anesthesia Other Findings   Reproductive/Obstetrics negative OB ROS                          Anesthesia Physical Anesthesia Plan  ASA: III  Anesthesia Plan: Spinal   Post-op Pain Management:    Induction: Intravenous  Airway Management Planned:   Additional Equipment:   Intra-op Plan:   Post-operative Plan: Extubation in OR  Informed Consent: I have reviewed the patients History and Physical, chart, labs and discussed the procedure including the risks, benefits and alternatives for the proposed anesthesia with the patient or authorized representative who has indicated his/her understanding and acceptance.   Dental advisory given  Plan Discussed with: CRNA  Anesthesia Plan Comments: (Discussed risks/benefits of spinal including headache, backache, failure, bleeding, infection, and nerve damage. Patient consents to spinal. Questions answered. Coagulation studies and platelet count  acceptable.)        Anesthesia Quick Evaluation

## 2012-06-29 NOTE — Brief Op Note (Signed)
06/29/2012  12:49 PM  PATIENT:  [name]  79 y.o. male  PRE-OPERATIVE DIAGNOSIS:  Severe osteoarthritis left hip  POST-OPERATIVE DIAGNOSIS:  Severe osteoarthritis left hip  PROCEDURE:  Procedure(s) with comments: LEFT TOTAL HIP ARTHROPLASTY ANTERIOR APPROACH (Left) - Left Total Hip Arthroplasty, Anterior Approach  SURGEON:  Surgeon(s) and Role:    * Kathryne Hitch, MD - Primary  PHYSICIAN ASSISTANT:   ASSISTANTS: none   ANESTHESIA:   spinal  EBL:  Total I/O In: 2300 [I.V.:1100; Blood:700; IV Piggyback:500] Out: 1560 [Urine:110; Blood:1450]  BLOOD ADMINISTERED:1000 CC PRBC  DRAINS: (medium) Hemovact drain(s) in the hip joint with  Suction Open   LOCAL MEDICATIONS USED:  NONE  SPECIMEN:  No Specimen  DISPOSITION OF SPECIMEN:  N/A  COUNTS:  YES  TOURNIQUET:  * No tourniquets in log *  DICTATION: .Other Dictation: Dictation Number [id number]  PLAN OF CARE: Admit to inpatient   PATIENT DISPOSITION:  PACU - hemodynamically stable.   Delay start of Pharmacological VTE agent (>24hrs) due to surgical blood loss or risk of bleeding: no

## 2012-06-30 LAB — CBC
MCHC: 34.6 g/dL (ref 30.0–36.0)
MCV: 87 fL (ref 78.0–100.0)
Platelets: 174 10*3/uL (ref 150–400)
RDW: 14.8 % (ref 11.5–15.5)
WBC: 8.7 10*3/uL (ref 4.0–10.5)

## 2012-06-30 LAB — BASIC METABOLIC PANEL
Chloride: 99 mEq/L (ref 96–112)
Creatinine, Ser: 1.62 mg/dL — ABNORMAL HIGH (ref 0.50–1.35)
GFR calc Af Amer: 43 mL/min — ABNORMAL LOW (ref >90)
GFR calc non Af Amer: 37 mL/min — ABNORMAL LOW (ref >90)
Potassium: 4.6 mEq/L (ref 3.5–5.1)

## 2012-06-30 NOTE — Evaluation (Signed)
Physical Therapy Evaluation Patient Details Name: [name] MRN: [id number] DOB: Dec 16, 1923 Today's Date: 06/30/2012 Time: 6213-0865 PT Time Calculation (min): 30 min  PT Assessment / Plan / Recommendation Clinical Impression  s/p left DA THA and will benefit from Pt to maximize indpendence in preparation for rehab setting and eventual transition to home    PT Assessment  Patient needs continued PT services    Follow Up Recommendations  SNF    Does the patient have the potential to tolerate intense rehabilitation      Barriers to Discharge Decreased caregiver support      Equipment Recommendations  None recommended by PT    Recommendations for Other Services     Frequency 7X/week    Precautions / Restrictions Restrictions Weight Bearing Restrictions: No LLE Weight Bearing: Weight bearing as tolerated   Pertinent Vitals/Pain       Mobility  Bed Mobility Bed Mobility: Supine to Sit Supine to Sit: 3: Mod assist Details for Bed Mobility Assistance: increased time; cues for technique Transfers Transfers: Sit to Stand;Stand to Sit Sit to Stand: 4: Min assist;3: Mod assist Stand to Sit: 4: Min assist;3: Mod assist Details for Transfer Assistance: cues for hand placemetn and LLE position for pain control/comfort Ambulation/Gait Ambulation/Gait Assistance: 4: Min assist Ambulation Distance (Feet): 14 Feet Assistive device: Rolling walker Ambulation/Gait Assistance Details: multi-modal cues for sequence, wt shift Gait Pattern: Step-to pattern;Antalgic Gait velocity: slow    Exercises Total Joint Exercises Ankle Circles/Pumps: AROM;Both;10 reps   PT Diagnosis: Difficulty walking  PT Problem List: Decreased strength;Decreased activity tolerance;Decreased range of motion;Decreased balance;Decreased mobility;Pain PT Treatment Interventions: DME instruction;Gait training;Functional mobility training;Therapeutic activities;Therapeutic exercise;Patient/family education    PT Goals Acute Rehab PT Goals PT Goal Formulation: With patient Time For Goal Achievement: 07/07/12 Potential to Achieve Goals: Good Pt will go Supine/Side to Sit: with supervision PT Goal: Supine/Side to Sit - Progress: Goal set today Pt will go Sit to Supine/Side: with supervision PT Goal: Sit to Supine/Side - Progress: Goal set today Pt will go Sit to Stand: with supervision PT Goal: Sit to Stand - Progress: Goal set today Pt will go Stand to Sit: with supervision PT Goal: Stand to Sit - Progress: Goal set today Pt will Ambulate: 51 - 150 feet;with supervision;with rolling walker PT Goal: Ambulate - Progress: Goal set today Pt will Perform Home Exercise Program: with supervision, verbal cues required/provided PT Goal: Perform Home Exercise Program - Progress: Goal set today  Visit Information  Last PT Received On: 06/30/12 Assistance Needed: +1    Subjective Data  Subjective: I didn't sleep worth noting last night Patient Stated Goal: home   Prior Functioning  Home Living Lives With: Alone Available Help at Discharge: Skilled Nursing Facility Type of Home: House Home Adaptive Equipment: None;Crutches;Walker - rolling;Bedside commode/3-in-1 Prior Function Level of Independence: Independent Able to Take Stairs?: Yes Driving: Yes Communication Communication: No difficulties    Cognition  Cognition Overall Cognitive Status: Appears within functional limits for tasks assessed/performed Arousal/Alertness: Awake/alert Orientation Level: Appears intact for tasks assessed Behavior During Session: [facility] for tasks performed    Extremity/Trunk Assessment Right Lower Extremity Assessment RLE ROM/Strength/Tone: [facility] for tasks assessed RLE ROM/Strength/Tone Deficits: grossly WFL; knee arthritic Left Lower Extremity Assessment LLE ROM/Strength/Tone: Deficits;Unable to fully assess;Due to pain LLE ROM/Strength/Tone Deficits: limited AROM due to hip pain; strength >3/5   Balance     End of Session PT - End of Session Activity Tolerance: Patient tolerated treatment well Patient left: in chair;with call bell/phone  within reach Nurse Communication: Mobility status  GP     [facility] 06/30/2012, 10:47 AM

## 2012-06-30 NOTE — Op Note (Signed)
[name], [name] NO.:  [id number]  MEDICAL RECORD NO.:  [id number]  LOCATION:  1615                         FACILITY:  [facility]  PHYSICIAN:  Vanita Panda. Magnus Ivan, M.D.DATE OF BIRTH:  [birth year]  DATE OF PROCEDURE:  06/29/2012 DATE OF DISCHARGE:                              OPERATIVE REPORT   PREOPERATIVE DIAGNOSES:  Severe end-stage arthritis and degenerative joint disease, left hip.  POSTOPERATIVE DIAGNOSES:  Severe end-stage arthritis and degenerative joint disease, left hip.  PROCEDURE:  Left total hip arthroplasty through direct anterior approach.  SURGEON:  Vanita Panda. Magnus Ivan, M.D.  ANESTHESIA:  Spinal.  ANTIBIOTICS:  2 g IV Ancef.  BLOOD LOSS:  Almost 2000 mL.  FLUIDS:  2 units of packed red blood cells.  COMPLICATIONS:  None.  INDICATIONS:  Mr. [name] is a 79 year old gentleman with severe end- stage arthritis involving his left hip.  He has significantly short hip comparing the left and right, the left hip being short.  He has essentially lost his joint space completely.  He has collapse of his femoral head, joint space narrowing, peripheral osteophytes, cystic changes on the acetabular and femoral head side, sclerotic changes.  At this point, his life he says is miserable.  His mobility is very limited.  His pain is daily and occurs at night.  He walks with a significant limp.  He wished at this point to proceed with a total hip arthroplasty.  The risks and benefits of surgery explained to him in detail and he did wish to proceed.  PROCEDURE DESCRIPTION:  After informed consent was obtained, appropriate left hip was marked.  He was brought to the operating room and spinal anesthesia was obtained while he was on the stretcher.  A Foley catheter was placed and both feet had traction boots applied on them.  He was then placed supine on the Hana fracture table with perineal post in place and both legs in inline skeletal traction  but no traction applied. We then assessed the center of the pelvis and both hips under direct fluoroscopy, so we could assess our leg lengths and offset.  We then had prepped the left hip with DuraPrep and sterile drapes and had sterile drapes on the C-arm as well.  A time-out was called to identify correct patient, correct left hip.  We then made an incision just posterior and inferior to the anterior superior iliac spine and carried this obliquely down the leg.  I dissected down to the tensor fascia lata and the tensor fascia was divided longitudinally.  I then proceeded with a direct anterior approach to the hip.  Cobra retractors were placed around the lateral neck and medial neck.  I did encounter significant amount of oozing and bleeding due to scar tissue.  I then made my femoral neck cut.  I then opened the hip capsule and put the osteotomes within the hip capsule.  There was severe deformity of the femoral head that we could easily see.  Then, I made my femoral neck cut with an oscillating saw.  We then used an osteotome and removed the femoral head in its entirety.  I then cleaned the acetabular debris and it took  me several times of ream which lead to more blood loss because of how terrible his hip socket was.  Once I was pleased with the reaming and with all reamers placed under direct fluoroscopic guidance, I went from size 42 reamer, all the way to a size 52.  With the last reamer I was able to get my depth of reaming, my inclination and __________.  I then placed a __________ Gription acetabular component size 52 from [employer] and an apex hole eliminator guide.  I placed a single screw and then 36+ 4 neutral polyethylene liner.  Attention was then turned to the femur.  With the leg externally rotated to 90 degrees, extended and adducted, I placed a Mueller retractor medially and Hohmann retractor __________ the greater trochanter.  I released what I could of the lateral capsule  and then brought the leg up some more.  It took quite some time to use a rongeur to lateralize as well as a box osteotome to gain access to the femoral canal.  I then began broaching from a size 8, broached up to a size 11; 11 was felt to be stable.  So, I trialed a standard neck and a 36+ 1.5 hip ball.  We brought the leg over and up __________ traction, internal rotation __________ stable with rotation with minimal __________ but his leg length showed that he was just a little bit short.  I felt that __________ we could go up a hip ball size.  I then dislocated the hip and removed the trial components.  I placed the real [place] femoral component from the __________ size 11 with standard offset (KA), I placed a real metal 36+ 5 hip ball.  We then reduced this back in the acetabulum and it was stable.  I then closed the joint capsule with interrupted #1 Ethibond suture.  I copiously irrigated the joint with normal saline solution using pulsatile lavage.  I then placed a medium Hemovac deep due to significant oozing he was having.  I closed the tensor fascia with a running 0 V-Loc suture followed by 2-0 Vicryl in the deep and subcutaneous tissue, and staples on the skin.  A well- padded Aquacel dressing was applied.  He was taken off the [facility] table. His leg lengths felt to be equal.  He was taken to recovery room in stable condition.  Postoperatively, we will watch his blood levels closely given he has got acute on chronic anemia and transfuse as appropriate.  We __________ therapy as well.     Vanita Panda. Magnus Ivan, M.D.     CYB/MEDQ  D:  06/29/2012  T:  06/30/2012  Job:  [id number]

## 2012-06-30 NOTE — Progress Notes (Signed)
06/30/12 1400  PT Visit Information  Last PT Received On 06/30/12  Assistance Needed +1  PT Time Calculation  PT Start Time 1345  PT Stop Time 1421  PT Time Calculation (min) 36 min  Subjective Data  Subjective I ma ready to go back to bed  Patient Stated Goal home after rehab  Restrictions  LLE Weight Bearing WBAT  Cognition  Overall Cognitive Status Appears within functional limits for tasks assessed/performed  Arousal/Alertness Awake/alert  Orientation Level Appears intact for tasks assessed  Behavior During Session [facility] for tasks performed  Bed Mobility  Bed Mobility Sit to Supine  Sit to Supine 4: Min assist  Details for Bed Mobility Assistance increased time; cues for technique  Transfers  Transfers Sit to Stand;Stand to Sit;Stand Pivot Transfers  Sit to Stand 4: Min assist;3: Mod assist  Stand to Sit 4: Min assist;3: Mod assist  Stand Pivot Transfers 4: Min assist  Details for Transfer Assistance cues for hand placement and LLE position for pain control/comfort  Ambulation/Gait  Ambulation/Gait Assistance 4: Min assist  Ambulation Distance (Feet) 5 Feet  Assistive device Rolling walker  Gait Pattern Step-to pattern;Antalgic  Gait velocity slow  Total Joint Exercises  Ankle Circles/Pumps AROM;Both;10 reps  Quad Sets AROM;Both;10 reps  Heel Slides AAROM;Left;10 reps  PT - End of Session  Activity Tolerance Patient tolerated treatment well  Patient left in bed;with call bell/phone within reach;with family/visitor present  Nurse Communication Mobility status;Weight bearing status  PT - Assessment/Plan  Comments on Treatment Session pt progressing, motivated but slow; pain 8/10 this pm; RN gave meds; comfortable at rest  PT Plan Discharge plan remains appropriate;Frequency remains appropriate  PT Frequency 7X/week  Follow Up Recommendations SNF  PT equipment None recommended by PT  Acute Rehab PT Goals  Time For Goal Achievement 07/07/12  Potential to Achieve Goals  Good  Pt will go Sit to Supine/Side with supervision  PT Goal: Sit to Supine/Side - Progress Progressing toward goal  Pt will go Sit to Stand with supervision  PT Goal: Sit to Stand - Progress Progressing toward goal  Pt will go Stand to Sit with supervision  PT Goal: Stand to Sit - Progress Progressing toward goal  Pt will Ambulate 51 - 150 feet;with supervision;with rolling walker  PT Goal: Ambulate - Progress Progressing toward goal  Pt will Perform Home Exercise Program with supervision, verbal cues required/provided  PT Goal: Perform Home Exercise Program - Progress Progressing toward goal  PT General Charges  $$ ACUTE PT VISIT 1 Procedure  PT Treatments  $Gait Training 8-22 mins  $Therapeutic Activity 8-22 mins

## 2012-06-30 NOTE — Progress Notes (Signed)
Subjective: Pt stable - eating breakfast   Objective: Vital signs in last 24 hours: Temp:  [97.4 F (36.3 C)-99.4 F (37.4 C)] 99.4 F (37.4 C) (03/01 0535) Pulse Rate:  [56-107] 107 (03/01 0535) Resp:  [13-20] 16 (03/01 0800) BP: (100-160)/(46-85) 131/79 mmHg (03/01 0535) SpO2:  [91 %-100 %] 100 % (03/01 0800) FiO2 (%):  [98 %] 98 % (02/28 1400) Weight:  [84.823 kg (187 lb)-84.9 kg (187 lb 2.7 oz)] 84.823 kg (187 lb) (02/28 1400)  Intake/Output from previous day: 02/28 0701 - 03/01 0700 In: 4695 [P.O.:600; I.V.:2795; Blood:700; IV Piggyback:600] Out: 2887 [Urine:885; Drains:550; Stool:2; Blood:1450] Intake/Output this shift:    Exam:  Neurovascular intact Sensation intact distally Intact pulses distally Dorsiflexion/Plantar flexion intact  Labs:  Recent Labs  06/29/12 1417 06/30/12 0445  HGB 8.8* 8.1*    Recent Labs  06/29/12 1417 06/30/12 0445  WBC  --  8.7  RBC  --  2.69*  HCT 25.9* 23.4*  PLT  --  174    Recent Labs  06/30/12 0445  NA 134*  K 4.6  CL 99  CO2 27  BUN 34*  CREATININE 1.62*  GLUCOSE 163*  CALCIUM 8.9   No results found for this basename: LABPT, INR,  in the last 72 hours  Assessment/Plan: Pt doing well - hgb a little low - cr up also - will follow am - cont PT   Rubylee Zamarripa SCOTT 06/30/2012, 9:00 AM

## 2012-07-01 LAB — CBC
HCT: 20.3 % — ABNORMAL LOW (ref 39.0–52.0)
MCHC: 33.5 g/dL (ref 30.0–36.0)
RDW: 15.3 % (ref 11.5–15.5)
WBC: 10.7 10*3/uL — ABNORMAL HIGH (ref 4.0–10.5)

## 2012-07-01 LAB — PREPARE RBC (CROSSMATCH)

## 2012-07-01 MED ORDER — FUROSEMIDE 10 MG/ML IJ SOLN
10.0000 mg | Freq: Once | INTRAMUSCULAR | Status: AC
  Administered 2012-07-01: 10 mg via INTRAVENOUS
  Filled 2012-07-01: qty 1

## 2012-07-01 MED ORDER — ACETAMINOPHEN 325 MG PO TABS
650.0000 mg | ORAL_TABLET | Freq: Once | ORAL | Status: AC
  Administered 2012-07-01: 650 mg via ORAL
  Filled 2012-07-01: qty 2

## 2012-07-01 NOTE — Progress Notes (Signed)
Patient put out adequate amount urine today, but small amts at a time, bladder scanned only 32ml, will pass on to night RN to continue to monitor.

## 2012-07-01 NOTE — Progress Notes (Signed)
Subjective: Pt stable - resting comfortably   Objective: Vital signs in last 24 hours: Temp:  [98.3 F (36.8 C)-99.7 F (37.6 C)] 99.7 F (37.6 C) (03/02 0905) Pulse Rate:  [70-98] 70 (03/02 0905) Resp:  [14-20] 16 (03/02 0905) BP: (87-139)/(54-76) 100/65 mmHg (03/02 0905) SpO2:  [96 %-100 %] 100 % (03/02 0500)  Intake/Output from previous day: 03/01 0701 - 03/02 0700 In: 1520 [P.O.:840; I.V.:680] Out: 675 [Urine:675] Intake/Output this shift:    Exam:  Neurovascular intact Sensation intact distally Intact pulses distally Dorsiflexion/Plantar flexion intact  Labs:  Recent Labs  06/29/12 1417 06/30/12 0445 07/01/12 0421  HGB 8.8* 8.1* 6.7*    Recent Labs  06/30/12 0445 07/01/12 0421  WBC 8.7 10.7*  RBC 2.69* 2.31*  HCT 23.4* 20.3*  PLT 174 151    Recent Labs  06/30/12 0445  NA 134*  K 4.6  CL 99  CO2 27  BUN 34*  CREATININE 1.62*  GLUCOSE 163*  CALCIUM 8.9   No results found for this basename: LABPT, INR,  in the last 72 hours  Assessment/Plan: hgb low - tx 2 u prbc - PT later - cr slightly elevated recheck am   DEAN,GREGORY SCOTT 07/01/2012, 9:53 AM

## 2012-07-01 NOTE — Progress Notes (Signed)
07/01/12 1000  PT Visit Information  Last PT Received On 07/01/12  Reason Eval/Treat Not Completed Medical issues which prohibited therapy (Hgb 6.7, transfusion pending; Hold per Dr. August Saucer )

## 2012-07-01 NOTE — Progress Notes (Signed)
OT Cancellation Note  Patient Details Name: [name] MRN: [id number] DOB: 05-Jun-1923   Cancelled Treatment:    Reason Eval/Treat Not Completed: Patient not medically ready (hgb 6.7 awaiting 2 units of blood ) Per Dr August Saucer note hold therapy pending the start of blood. Ot to reattempt 3/ 3/ 14.   Lucile Shutters Pager: [phone]  07/01/2012, 10:09 AM

## 2012-07-01 NOTE — Progress Notes (Signed)
Spoke to Dr. August Saucer informed him patient had oxy Ir and benadryl earlier this am and has been in very deep sleep. Was alert and oriented is am but woke up and thought he was in [place], [place] states patient had some periods of confusion baseline. Informed that patient woke up for awhile and scheduled oxycontin ER given but now back to deep sleep, MD states to hold off on any more benadryl or Oxycodone IR until patient more alert and oriented.

## 2012-07-02 ENCOUNTER — Encounter (HOSPITAL_COMMUNITY): Payer: Self-pay | Admitting: Orthopaedic Surgery

## 2012-07-02 LAB — BASIC METABOLIC PANEL
CO2: 27 mEq/L (ref 19–32)
Chloride: 99 mEq/L (ref 96–112)
Creatinine, Ser: 2.26 mg/dL — ABNORMAL HIGH (ref 0.50–1.35)
GFR calc Af Amer: 29 mL/min — ABNORMAL LOW (ref >90)
Potassium: 4.6 mEq/L (ref 3.5–5.1)
Sodium: 133 mEq/L — ABNORMAL LOW (ref 135–145)

## 2012-07-02 LAB — PREPARE RBC (CROSSMATCH)

## 2012-07-02 LAB — CBC
MCV: 89.5 fL (ref 78.0–100.0)
Platelets: 146 10*3/uL — ABNORMAL LOW (ref 150–400)
RBC: 2.67 MIL/uL — ABNORMAL LOW (ref 4.22–5.81)
RDW: 15 % (ref 11.5–15.5)
WBC: 11.2 10*3/uL — ABNORMAL HIGH (ref 4.0–10.5)

## 2012-07-02 MED ORDER — HYDROCODONE-ACETAMINOPHEN 5-325 MG PO TABS: 1.0000 | ORAL_TABLET | ORAL | Status: DC | PRN

## 2012-07-02 MED ORDER — TRAMADOL HCL 50 MG PO TABS: 50.0000 mg | ORAL_TABLET | Freq: Four times a day (QID) | ORAL | Status: DC | PRN

## 2012-07-02 MED ORDER — ASPIRIN 325 MG PO TABS
325.0000 mg | ORAL_TABLET | Freq: Every day | ORAL | Status: DC
  Administered 2012-07-02 – 2012-07-03 (×2): 325 mg via ORAL
  Filled 2012-07-02 (×2): qty 1

## 2012-07-02 MED ORDER — FUROSEMIDE 10 MG/ML IJ SOLN
20.0000 mg | Freq: Once | INTRAMUSCULAR | Status: DC
  Filled 2012-07-02: qty 2

## 2012-07-02 NOTE — Progress Notes (Signed)
Noted pt plans to DC to SNF. Will defer OT eval to SNF.   Thanks Lise Auer, OT

## 2012-07-02 NOTE — Progress Notes (Signed)
Physical Therapy Treatment Patient Details Name: [name] MRN: [id number] DOB: [birth year] Today's Date: 07/02/2012 Time: 9528-4132 PT Time Calculation (min): 27 min  PT Assessment / Plan / Recommendation Comments on Treatment Session  POD # 3 L THR Direct Anterior Approach.  AM session withheld 2nd HgB waiting for second unit.  Assisted pt OOB to amb limited distance.  RA decreased to 84%.     Follow Up Recommendations  SNF     Does the patient have the potential to tolerate intense rehabilitation     Barriers to Discharge        Equipment Recommendations  None recommended by PT    Recommendations for Other Services    Frequency 7X/week   Plan Discharge plan remains appropriate;Frequency remains appropriate     Precautions / Restrictions Precautions Precautions: Fall Restrictions Weight Bearing Restrictions: No LLE Weight Bearing: Weight bearing as tolerated   Pertinent Vitals/Pain C/o 6/10 pain and "tightness'     Mobility  Bed Mobility Bed Mobility: Sit to Supine;Sitting - Scoot to Edge of Bed Supine to Sit: 2: Max assist Sitting - Scoot to [place] of Bed: 2: Max assist Details for Bed Mobility Assistance: increased time; cues for technique Transfers Transfers: Sit to Stand;Stand to Sit Sit to Stand: 3: Mod assist;From bed;From elevated surface Stand to Sit: 3: Mod assist;To chair/3-in-1 Details for Transfer Assistance: cues for hand placement and LLE position for pain control/comfort Ambulation/Gait Ambulation/Gait Assistance: 4: Min assist Ambulation Distance (Feet): 46 Feet Assistive device: Rolling walker Ambulation/Gait Assistance Details: 50% VC's to motivate to increas amb distance and proper walker to self distance. No c/o dizzyness. Gait Pattern: Step-to pattern;Antalgic Gait velocity: slow     PT Goals                                                           Progressing slowly    Visit Information  Last PT Received On: 07/02/12 Assistance  Needed: +1    Subjective Data      Cognition    fair+   Balance   poor  End of Session PT - End of Session Equipment Utilized During Treatment: Gait belt Activity Tolerance: Patient limited by fatigue Patient left: in chair;with call bell/phone within reach;with family/visitor present   Felecia Shelling  PTA [facility]  Acute  Rehab Pager      [phone]

## 2012-07-02 NOTE — Progress Notes (Signed)
Patient ID: [name], male   DOB: Feb 18, 1924, 79 y.o.   MRN: [id number] Will keep here one more day due to symptomatic acute blood loss anemia.  Will start aspirin once daily (ok to do so even given past "allergy"). Stop Xarelto. Discharge tomorrow to SNF.

## 2012-07-02 NOTE — Progress Notes (Signed)
CSW assisting with d/c planning. [facility] will have a [facility] bed for pt Tuesday if stable for d/c. Pt has been updated. CSW will continue to follow to assist with d/c planning to SNF.  Cori Razor LCSW

## 2012-07-02 NOTE — Clinical Documentation Improvement (Signed)
Abnormal Labs Clarification  THIS DOCUMENT IS NOT A PERMANENT PART OF THE MEDICAL RECORD  TO RESPOND TO THE THIS QUERY, FOLLOW THE INSTRUCTIONS BELOW:  1. If needed, update documentation for the patient's encounter via the notes activity.  2. Access this query again and click edit on the [employer].  3. After updating, or not, click F2 to complete all highlighted (required) fields concerning your review. Select "additional documentation in the medical record" OR "no additional documentation provided".  4. Click Sign note button.  5. The deficiency will fall out of your InBasket *Please let us know if you are not able to complete this workflow by phone or e-mail (listed below).  Please update your documentation within the medical record to reflect your response to this query.                                                                                   07/02/12  Dear Dr. Kathryne Hitch Marton Redwood  In a better effort to capture your patient's severity of illness, reflect appropriate length of stay and utilization of resources, a review of the medical record has revealed the following indicators.    Based on your clinical judgment, please clarify and document in a progress note and/or discharge summary the clinical condition associated with the following supporting information:  In responding to this query please exercise your independent judgment.  The fact that a query is asked, does not imply that any particular answer is desired or expected.  Abnormal findings (laboratory, x-ray, pathologic, and other diagnostic results) are not coded and reported unless the physician indicates their clinical significance.   The medical record reflects the following clinical findings, please clarify the diagnostic and/or clinical significance:      Clarification Needed    Please clarify the underlying diagnosis responsible for the abnormal Na lab=133 and document in pn or d/c  summary    Treatment   NS IVF     Possible Clinical Conditions?                                  ____________________                                      Other Condition___________________                 Cannot Clinically Determine_________     Supporting Information: End Stage Arthritis, L THA.   Diagnostic: Component     Latest Ref Rng 06/30/2012 07/02/2012  Sodium     135 - 145 mEq/L 134 (L) 133 (L)    Treatment:  Listed above   Reviewed:  no additional documentation provided   Thank You,  Enis Slipper  RN, BSN, MSN/Inf, CCDS Clinical Documentation Specialist [facility] Dept Pager: [phone] / E-mail: [email or web address]  Health Information Management [place]

## 2012-07-02 NOTE — Care Management Note (Signed)
    Page 1 of 1   07/02/2012     9:55:29 AM   CARE MANAGEMENT NOTE 07/02/2012  Patient:  Candella,Camauri   Account Number:  [id number]  Date Initiated:  07/02/2012  Documentation initiated by:  Lorenda Ishihara  Subjective/Objective Assessment:   79 yo male admitted s/p Left total hip arthroplasty through direct anterior  approach. PTA lived at home alone.     Action/Plan:   Plan for SNF for rehab   Anticipated DC Date:  07/03/2012   Anticipated DC Plan:  SKILLED NURSING FACILITY  In-house referral  Clinical Social Worker      DC Planning Services  CM consult      Choice offered to / List presented to:             Status of service:  Completed, signed off Medicare Important Message given?   (If response is "NO", the following Medicare IM given date fields will be blank) Date Medicare IM given:   Date Additional Medicare IM given:    Discharge Disposition:  SKILLED NURSING FACILITY  Per UR Regulation:  Reviewed for med. necessity/level of care/duration of stay  If discussed at Long Length of Stay Meetings, dates discussed:    Comments:

## 2012-07-03 LAB — TYPE AND SCREEN
Unit division: 0
Unit division: 0
Unit division: 0
Unit division: 0

## 2012-07-03 MED ORDER — ASPIRIN 325 MG PO TABS: 325.0000 mg | ORAL_TABLET | Freq: Every day | ORAL | Status: DC

## 2012-07-03 MED ORDER — POLYETHYLENE GLYCOL 3350 17 G PO PACK: 17.0000 g | PACK | Freq: Every day | ORAL | Status: DC

## 2012-07-03 MED ORDER — FERROUS SULFATE 325 (65 FE) MG PO TABS: 325.0000 mg | ORAL_TABLET | Freq: Three times a day (TID) | ORAL | Status: DC

## 2012-07-03 MED ORDER — TRAMADOL HCL 50 MG PO TABS: 50.0000 mg | ORAL_TABLET | Freq: Four times a day (QID) | ORAL | Status: DC | PRN

## 2012-07-03 NOTE — Progress Notes (Signed)
Patient ID: [name], male   DOB: 1923-08-15, 79 y.o.   MRN: [id number] Looks really good this am.  Vital signs are stable.  Eating well. Only mild pain. His incision is clean and dry.  Can discharge to SNF today.

## 2012-07-03 NOTE — Discharge Summary (Signed)
Patient ID: [name] MRN: [id number] DOB/AGE: [birth year] 79 y.o.  Admit date: 06/29/2012 Discharge date: 07/03/2012  Admission Diagnoses:  Principal Problem:   Degenerative arthritis of hip   Discharge Diagnoses:  Same  Past Medical History  Diagnosis Date  . History of knee replacement, total 06-19-12    2011  . Hypertension   . Cancer 06-19-12    Hx. 15 yrs ago -seed implant-PSA remains elevated somewhat.  . Arthritis     Osteoarthritis(right shoulder)-knees, hip . s/p LTKA  . Transfusion history 06-19-12    history s/p '11- LTKA  . Gout   . Dysrhythmia 06-19-12    medical record note shows hx R)BBB-. A.Fib/ flutter-not present    Surgeries: Procedure(s): LEFT TOTAL HIP ARTHROPLASTY ANTERIOR APPROACH on 06/29/2012   Consultants:    Discharged Condition: Improved  Hospital Course: [name] is an 79 y.o. male who was admitted 06/29/2012 for operative treatment ofDegenerative arthritis of hip. Patient has severe unremitting pain that affects sleep, daily activities, and work/hobbies. After pre-op clearance the patient was taken to the operating room on 06/29/2012 and underwent  Procedure(s): LEFT TOTAL HIP ARTHROPLASTY ANTERIOR APPROACH.    Patient was given perioperative antibiotics: Anti-infectives   Start     Dose/Rate Route Frequency Ordered Stop   06/29/12 1600  ceFAZolin (ANCEF) IVPB 1 g/50 mL premix     1 g 100 mL/hr over 30 Minutes Intravenous Every 6 hours 06/29/12 1423 06/29/12 2159   06/29/12 0701  ceFAZolin (ANCEF) IVPB 2 g/50 mL premix     2 g 100 mL/hr over 30 Minutes Intravenous On call to O.R. 06/29/12 0701 06/29/12 1016       Patient was given sequential compression devices, early ambulation, and chemoprophylaxis to prevent DVT.  Patient benefited maximally from hospital stay and there were no complications.  He did receive 2 transfusions secondary to acute blood loss anemia on top of chronic anemia.  Recent vital signs: Patient Vitals for the  past 24 hrs:  BP Temp Temp src Pulse Resp SpO2  07/03/12 0611 159/72 mmHg 98.7 F (37.1 C) Oral 75 14 99 %  07/03/12 0400 - - - - 16 100 %  07/03/12 0000 - - - - 16 100 %  07/02/12 2128 - 99 F (37.2 C) Oral - - -  07/02/12 2053 169/84 mmHg 100.2 F (37.9 C) Oral 90 16 100 %  07/02/12 2000 - - - - 16 100 %  07/02/12 1620 134/62 mmHg 98.3 F (36.8 C) Oral 94 16 -  07/02/12 1605 178/72 mmHg 100.1 F (37.8 C) Oral 84 16 100 %  07/02/12 1505 134/61 mmHg 98.3 F (36.8 C) Oral 83 16 -  07/02/12 1405 168/80 mmHg 99.9 F (37.7 C) Oral 80 16 -  07/02/12 1330 152/75 mmHg 99.6 F (37.6 C) Oral 79 16 -  07/02/12 1240 159/64 mmHg 98.7 F (37.1 C) Oral 86 18 -  07/02/12 1210 136/91 mmHg 98.4 F (36.9 C) Oral 80 16 -  07/02/12 1110 145/74 mmHg 100 F (37.8 C) Oral 83 16 -  07/02/12 1010 132/71 mmHg 99.4 F (37.4 C) Oral 84 20 -  07/02/12 0940 138/64 mmHg 99.6 F (37.6 C) Oral 85 16 -     Recent laboratory studies:  Recent Labs  07/01/12 0421 07/02/12 0405  WBC 10.7* 11.2*  HGB 6.7* 7.9*  HCT 20.3* 23.9*  PLT 151 146*  NA  --  133*  K  --  4.6  CL  --  99  CO2  --  27  BUN  --  45*  CREATININE  --  2.26*  GLUCOSE  --  110*  CALCIUM  --  9.3     Discharge Medications:     Medication List    TAKE these medications       acetaminophen 500 MG tablet  Commonly known as:  TYLENOL  Take 500 mg by mouth every 6 (six) hours as needed (For knee pain.).     allopurinol 100 MG tablet  Commonly known as:  ZYLOPRIM  Take 100 mg by mouth at bedtime.     aspirin 325 MG tablet  Take 1 tablet (325 mg total) by mouth daily.     ferrous sulfate 325 (65 FE) MG tablet  Take 1 tablet (325 mg total) by mouth 3 (three) times daily after meals.     Fish Oil 1200 MG Caps  Take 1,200 mg by mouth at bedtime. Includes 360mg  of Omega-3.     furosemide 40 MG tablet  Commonly known as:  LASIX  Take 40 mg by mouth every morning.     metoprolol tartrate 25 MG tablet  Commonly known as:   LOPRESSOR  Take 25 mg by mouth 2 (two) times daily.     multivitamin with minerals Tabs  Take 1 tablet by mouth every morning.     olmesartan 40 MG tablet  Commonly known as:  BENICAR  Take 40 mg by mouth at bedtime.     pravastatin 20 MG tablet  Commonly known as:  PRAVACHOL  Take 20 mg by mouth at bedtime.     tamsulosin 0.4 MG Caps  Commonly known as:  FLOMAX  Take 0.4 mg by mouth at bedtime.     traMADol 50 MG tablet  Commonly known as:  ULTRAM  Take 1 tablet (50 mg total) by mouth every 6 (six) hours as needed for pain.        Diagnostic Studies: Dg Chest 2 View  06/19/2012  *RADIOLOGY REPORT*  Clinical Data: 79 year old male preoperative study left hip surgery.  Hypertension.  CHEST - 2 VIEW  Comparison: 03/14/2010 and earlier.  Findings: Chronic elevation of the right hemidiaphragm is stable. Stable lung volumes.  Cardiac size and mediastinal contours are within normal limits.  Visualized tracheal air column is within normal limits.  No pneumothorax, pulmonary edema, pleural effusion or acute pulmonary opacity. Stable visualized osseous structures.  IMPRESSION: No acute cardiopulmonary abnormality.   Original Report Authenticated By: Erskine Speed, M.D.    Dg Hip Complete Left  06/29/2012  *RADIOLOGY REPORT*  Clinical Data: LEFT TOTAL HIP REPLACEMENT.  DG C-ARM 61-120 MIN - NRPT [facility],LEFT HIP - COMPLETE 2+ VIEW  Comparison: None.  Findings: Two intraoperative spot images demonstrate changes of left hip replacement.  Normal AP alignment.  No visible hardware or complicating feature.  Radiopaque densities in the region of the prostate, presumably radiation seeds.  IMPRESSION: Left hip replacement.  No complicating feature visualized on these intraoperative spot images.   Original Report Authenticated By: Charlett Nose, M.D.    Dg Pelvis Portable  06/29/2012  *RADIOLOGY REPORT*  Clinical Data: Status post left hip surgery.  PORTABLE PELVIS  Comparison: Fluoro spot films from the same  day.  Findings: A single AP view pelvis demonstrates that the patient is status post left total hip arthroplasty.  The acetabular and femoral components are well seated. The hip is located.  A surgical drain is in place.  Radiation seeds are noted in the  region of the prostate gland.  IMPRESSION:  1.  Status post left total hip arthroplasty without radiographic evidence for complication.   Original Report Authenticated By: Marin Roberts, M.D.    Dg Hip Portable 1 View Left  06/29/2012  *RADIOLOGY REPORT*  Clinical Data: Status post left total hip arthroplasty.  PORTABLE LEFT HIP - 1 VIEW  Comparison: Intraoperative fluoro spot images.  Single AP view pelvis.  Findings: The patient is status post left total hip arthroplasty. A cross-table lateral view confirms that the prosthesis is located. No acute fracture is present.  A surgical drain is in place.  IMPRESSION: Status post left total hip arthroplasty without radiographic evidence for complication.   Original Report Authenticated By: Marin Roberts, M.D.    Dg C-arm 61-120 Min-no Report  06/29/2012  *RADIOLOGY REPORT*  Clinical Data: LEFT TOTAL HIP REPLACEMENT.  DG C-ARM 61-120 MIN - [facility]PT [facility],LEFT HIP - COMPLETE 2+ VIEW  Comparison: None.  Findings: Two intraoperative spot images demonstrate changes of left hip replacement.  Normal AP alignment.  No visible hardware or complicating feature.  Radiopaque densities in the region of the prostate, presumably radiation seeds.  IMPRESSION: Left hip replacement.  No complicating feature visualized on these intraoperative spot images.   Original Report Authenticated By: Charlett Nose, M.D.     Disposition: to skilled nursing facility      Discharge Orders   Future Orders Complete By Expires     Call MD / Call 911  As directed     Comments:      If you experience chest pain or shortness of breath, CALL 911 and be transported to the hospital emergency room.  If you develope a fever above 101 F, pus  (white drainage) or increased drainage or redness at the wound, or calf pain, call your surgeon's office.    Constipation Prevention  As directed     Comments:      Drink plenty of fluids.  Prune juice may be helpful.  You may use a stool softener, such as Colace (over the counter) 100 mg twice a day.  Use MiraLax (over the counter) for constipation as needed.    Diet - low sodium heart healthy  As directed     Discharge instructions  As directed     Comments:      Increase activities as comfort allows. Full weight bearing as tolerated left hip; no hip precautions. Can get left hip incision wet starting 07/04/12; then daily dry dressing. Expect thigh, leg, and foot swelling.    Discharge patient  As directed     Increase activity slowly as tolerated  As directed        Follow-up Information   Follow up with Kathryne Hitch, MD In 2 weeks.   Contact information:   [address] [place] [address] [place] [address] [phone]        Signed: Kathryne Hitch 07/03/2012, 8:36 AM

## 2012-07-03 NOTE — Progress Notes (Signed)
Clinical Social Work Department CLINICAL SOCIAL WORK PLACEMENT NOTE 07/03/2012  Patient:  [name],[name]  Account Number:  [id number] Admit date:  06/29/2012  Clinical Social Worker:  Cori Razor, LCSW  Date/time:  06/29/2012 03:50 PM  Clinical Social Work is seeking post-discharge placement for this patient at the following level of care:   SKILLED NURSING   (*CSW will update this form in Epic as items are completed)   06/29/2012  Patient/family provided with [facility] Department of Clinical Social Work's list of facilities offering this level of care within the geographic area requested by the patient (or if unable, by the patient's family).  06/29/2012  Patient/family informed of their freedom to choose among providers that offer the needed level of care, that participate in Medicare, Medicaid or managed care program needed by the patient, have an available bed and are willing to accept the patient.    Patient/family informed of [facility]' ownership interest in [facility], as well as of the fact that they are under no obligation to receive care at this facility.  PASARR submitted to EDS on 06/29/2012 PASARR number received from EDS on 06/29/2012  FL2 transmitted to all facilities in geographic area requested by pt/family on  06/29/2012 FL2 transmitted to all facilities within larger geographic area on   Patient informed that his/her managed care company has contracts with or will negotiate with  certain facilities, including the following:     Patient/family informed of bed offers received:  06/29/2012 Patient chooses bed at [facility] Physician recommends and patient chooses bed at    Patient to be transferred to [facility] on  07/03/2012 Patient to be transferred to facility by [facility]  The following physician request were entered in Epic:   Additional Comments:  Cori Razor LCSW [phone]

## 2012-11-29 ENCOUNTER — Other Ambulatory Visit: Payer: Self-pay | Admitting: Internal Medicine

## 2012-11-29 ENCOUNTER — Other Ambulatory Visit (HOSPITAL_COMMUNITY): Payer: Self-pay | Admitting: Internal Medicine

## 2012-11-29 ENCOUNTER — Ambulatory Visit (HOSPITAL_COMMUNITY)
Admission: RE | Admit: 2012-11-29 | Discharge: 2012-11-29 | Disposition: A | Discharge status: Home / Self Care | Payer: Medicare Other | Source: Ambulatory Visit | Attending: Internal Medicine | Admitting: Internal Medicine

## 2012-11-29 DIAGNOSIS — M7989 Other specified soft tissue disorders: Secondary | ICD-10-CM

## 2012-11-29 DIAGNOSIS — M79661 Pain in right lower leg: Secondary | ICD-10-CM

## 2012-11-29 DIAGNOSIS — M79609 Pain in unspecified limb: Secondary | ICD-10-CM

## 2012-11-29 DIAGNOSIS — R609 Edema, unspecified: Secondary | ICD-10-CM

## 2012-11-29 DIAGNOSIS — I4892 Unspecified atrial flutter: Secondary | ICD-10-CM | POA: Insufficient documentation

## 2012-11-29 DIAGNOSIS — R52 Pain, unspecified: Secondary | ICD-10-CM

## 2012-11-29 NOTE — Progress Notes (Signed)
Right lower extremity venous duplex completed.  Right:  No evidence of DVT, superficial thrombosis, or Baker's cyst.  Left:  Negative for DVT in the common femoral vein.  

## 2012-11-30 ENCOUNTER — Other Ambulatory Visit: Payer: Medicare Other

## 2013-04-29 ENCOUNTER — Other Ambulatory Visit: Payer: Self-pay | Admitting: Internal Medicine

## 2013-04-29 DIAGNOSIS — R945 Abnormal results of liver function studies: Secondary | ICD-10-CM

## 2013-04-29 DIAGNOSIS — N289 Disorder of kidney and ureter, unspecified: Secondary | ICD-10-CM

## 2013-05-06 ENCOUNTER — Ambulatory Visit
Admission: RE | Admit: 2013-05-06 | Discharge: 2013-05-06 | Disposition: A | Discharge status: Home / Self Care | Payer: Medicare Other | Source: Ambulatory Visit | Attending: Internal Medicine | Admitting: Internal Medicine

## 2013-05-06 DIAGNOSIS — R7989 Other specified abnormal findings of blood chemistry: Secondary | ICD-10-CM

## 2013-05-06 DIAGNOSIS — N289 Disorder of kidney and ureter, unspecified: Secondary | ICD-10-CM

## 2013-05-06 DIAGNOSIS — R945 Abnormal results of liver function studies: Secondary | ICD-10-CM

## 2013-07-25 ENCOUNTER — Other Ambulatory Visit (HOSPITAL_COMMUNITY): Payer: Self-pay | Admitting: Orthopaedic Surgery

## 2013-08-01 ENCOUNTER — Encounter (HOSPITAL_COMMUNITY): Payer: Self-pay | Admitting: Pharmacy Technician

## 2013-08-07 ENCOUNTER — Other Ambulatory Visit (HOSPITAL_COMMUNITY): Payer: Self-pay | Admitting: *Deleted

## 2013-08-07 NOTE — Patient Instructions (Addendum)
[name]  08/07/2013                           YOUR PROCEDURE IS SCHEDULED ON: 08/16/13               PLEASE REPORT TO SHORT STAY CENTER AT : 5:30 AM               CALL THIS NUMBER IF ANY PROBLEMS THE DAY OF SURGERY :               [phone]                                REMEMBER:   Do not eat food or drink liquids AFTER MIDNIGHT                  Take these medicines the morning of surgery with A SIP OF WATER: NONE   Do not wear jewelry, make-up   Do not wear lotions, powders, or perfumes.   Do not shave legs or underarms 12 hrs. before surgery (men may shave face)  Do not bring valuables to the hospital.  Contacts, dentures or bridgework may not be worn into surgery.  Leave suitcase in the car. After surgery it may be brought to your room.  For patients admitted to the hospital more than one night, checkout time is            11:00 AM                                                       The day of discharge.   Patients discharged the day of surgery will not be allowed to drive home.            If going home same day of surgery, must have someone stay with you              FIRST 24 hrs at home and arrange for some one to drive you              home from hospital.    Special Instructions             Please read over the following fact sheets that you were given:               1. [place]               2. MRSA INFORMATION SHEET                3. STOP VITAMINS / [place] / ALEVE IBUPROFEN MOTRIN 5 [place]                                                X_____________________________________________________________________        Failure to follow these instructions may result in cancellation of your surgery

## 2013-08-08 ENCOUNTER — Encounter (HOSPITAL_COMMUNITY)
Admission: RE | Admit: 2013-08-08 | Discharge: 2013-08-08 | Disposition: A | Discharge status: Home / Self Care | Payer: Medicare Other | Source: Ambulatory Visit | Attending: Orthopaedic Surgery | Admitting: Orthopaedic Surgery

## 2013-08-08 ENCOUNTER — Encounter (HOSPITAL_COMMUNITY): Payer: Self-pay

## 2013-08-08 DIAGNOSIS — Z01810 Encounter for preprocedural cardiovascular examination: Secondary | ICD-10-CM | POA: Insufficient documentation

## 2013-08-08 DIAGNOSIS — Z01812 Encounter for preprocedural laboratory examination: Secondary | ICD-10-CM | POA: Insufficient documentation

## 2013-08-08 HISTORY — DX: Frequency of micturition: R35.0

## 2013-08-08 HISTORY — DX: Anemia, unspecified: D64.9

## 2013-08-08 LAB — BASIC METABOLIC PANEL
BUN: 49 mg/dL — AB (ref 6–23)
CHLORIDE: 94 meq/L — AB (ref 96–112)
CO2: 26 meq/L (ref 19–32)
CREATININE: 2.36 mg/dL — AB (ref 0.50–1.35)
Calcium: 10.4 mg/dL (ref 8.4–10.5)
GFR calc Af Amer: 27 mL/min — ABNORMAL LOW (ref >90)
GFR calc non Af Amer: 23 mL/min — ABNORMAL LOW (ref >90)
Glucose, Bld: 90 mg/dL (ref 70–99)
Potassium: 4.1 mEq/L (ref 3.7–5.3)
Sodium: 135 mEq/L — ABNORMAL LOW (ref 137–147)

## 2013-08-08 LAB — URINALYSIS, ROUTINE W REFLEX MICROSCOPIC
BILIRUBIN URINE: NEGATIVE
Glucose, UA: NEGATIVE mg/dL
HGB URINE DIPSTICK: NEGATIVE
Ketones, ur: NEGATIVE mg/dL
Leukocytes, UA: NEGATIVE
Nitrite: NEGATIVE
Protein, ur: NEGATIVE mg/dL
SPECIFIC GRAVITY, URINE: 1.007 (ref 1.005–1.030)
UROBILINOGEN UA: 0.2 mg/dL (ref 0.0–1.0)
pH: 6 (ref 5.0–8.0)

## 2013-08-08 LAB — CBC
HEMATOCRIT: 25.9 % — AB (ref 39.0–52.0)
Hemoglobin: 8.8 g/dL — ABNORMAL LOW (ref 13.0–17.0)
MCH: 30.3 pg (ref 26.0–34.0)
MCHC: 34 g/dL (ref 30.0–36.0)
MCV: 89.3 fL (ref 78.0–100.0)
Platelets: 209 10*3/uL (ref 150–400)
RBC: 2.9 MIL/uL — ABNORMAL LOW (ref 4.22–5.81)
RDW: 14.6 % (ref 11.5–15.5)
WBC: 5.6 10*3/uL (ref 4.0–10.5)

## 2013-08-08 LAB — APTT: aPTT: 29 seconds (ref 24–37)

## 2013-08-08 LAB — SURGICAL PCR SCREEN
MRSA, PCR: NEGATIVE
Staphylococcus aureus: NEGATIVE

## 2013-08-08 LAB — PROTIME-INR
INR: 1.02 (ref 0.00–1.49)
Prothrombin Time: 13.2 seconds (ref 11.6–15.2)

## 2013-08-08 NOTE — Progress Notes (Signed)
08/08/13 1115  OBSTRUCTIVE SLEEP APNEA  Have you ever been diagnosed with sleep apnea through a sleep study? No  Do you snore loudly (loud enough to be heard through closed doors)?  1  Do you often feel tired, fatigued, or sleepy during the daytime? 0  Has anyone observed you stop breathing during your sleep? 0  Do you have, or are you being treated for high blood pressure? 1  BMI more than 35 kg/m2? 0  Age over 78 years old? 1  Neck circumference greater than 40 cm/18 inches? 0  Gender: 1  Obstructive Sleep Apnea Score 4  Score 4 or greater  Results sent to PCP

## 2013-08-15 ENCOUNTER — Inpatient Hospital Stay (HOSPITAL_COMMUNITY): Payer: Medicare Other | Admitting: Anesthesiology

## 2013-08-15 ENCOUNTER — Encounter (HOSPITAL_COMMUNITY): Payer: Medicare Other | Admitting: Anesthesiology

## 2013-08-15 NOTE — Anesthesia Preprocedure Evaluation (Addendum)
Anesthesia Evaluation  Patient identified by MRN, date of birth, ID band Patient awake    Reviewed: Allergy & Precautions, H&P , NPO status , Patient's Chart, lab work & pertinent test results, reviewed documented beta blocker date and time   Airway Mallampati: II TM Distance: >3 FB Neck ROM: Full    Dental no notable dental hx. (+) Edentulous Upper, Edentulous Lower   Pulmonary neg pulmonary ROS, former smoker,  breath sounds clear to auscultation  Pulmonary exam normal       Cardiovascular hypertension, Pt. on medications + dysrhythmias Atrial Fibrillation Rhythm:Regular Rate:Normal  Has never been on coumadin. ECG shows RBBB and NSR  CXR clear   Neuro/Psych negative neurological ROS  negative psych ROS   GI/Hepatic negative GI ROS, Neg liver ROS,   Endo/Other  negative endocrine ROS  Renal/GU Renal InsufficiencyRenal diseaseCr 2.36   negative genitourinary   Musculoskeletal negative musculoskeletal ROS (+)   Abdominal   Peds negative pediatric ROS (+)  Hematology negative hematology ROS (+) anemia ,   Anesthesia Other Findings   Reproductive/Obstetrics negative OB ROS                        Anesthesia Physical Anesthesia Plan  ASA: III  Anesthesia Plan:    Post-op Pain Management:    Induction:   Airway Management Planned:   Additional Equipment:   Intra-op Plan:   Post-operative Plan:   Informed Consent: I have reviewed the patients History and Physical, chart, labs and discussed the procedure including the risks, benefits and alternatives for the proposed anesthesia with the patient or authorized representative who has indicated his/her understanding and acceptance.   Dental advisory given  Plan Discussed with: CRNA  Anesthesia Plan Comments: (Patient made aware of abnormal renal function studies. Patient states he has had no prior knowledge of renal dysfunction despite  undergoing abdominal ultrasound on 12/14 for renal evaluation. Note significant change in Cr/BUN following prior orthopedic hip replacement 10/29/50,  complicated by hypotension intraoperatively with blood loss, requiring postop blood transfusion. Discussed patients underlying renal dysfunction with Dr. Ninfa Linden and will elect to delay surgery until further evaluation by Nephrologist. Patient will require tight BP control intra-op in the event that proceed with surgery at a future date. Freddie Apley, MD)       Anesthesia Quick Evaluation

## 2013-08-16 ENCOUNTER — Ambulatory Visit (HOSPITAL_COMMUNITY)
Admission: RE | Admit: 2013-08-16 | Discharge: 2013-08-16 | Disposition: A | Discharge status: Home / Self Care | Payer: Medicare Other | Source: Ambulatory Visit | Attending: Orthopaedic Surgery | Admitting: Orthopaedic Surgery

## 2013-08-16 ENCOUNTER — Encounter (HOSPITAL_COMMUNITY): Payer: Self-pay | Admitting: *Deleted

## 2013-08-16 ENCOUNTER — Encounter (HOSPITAL_COMMUNITY)
Admission: RE | Disposition: A | Discharge status: Home / Self Care | Payer: Self-pay | Source: Ambulatory Visit | Attending: Orthopaedic Surgery

## 2013-08-16 DIAGNOSIS — D649 Anemia, unspecified: Secondary | ICD-10-CM | POA: Insufficient documentation

## 2013-08-16 DIAGNOSIS — Z5309 Procedure and treatment not carried out because of other contraindication: Secondary | ICD-10-CM | POA: Insufficient documentation

## 2013-08-16 DIAGNOSIS — I10 Essential (primary) hypertension: Secondary | ICD-10-CM | POA: Insufficient documentation

## 2013-08-16 DIAGNOSIS — I4510 Unspecified right bundle-branch block: Secondary | ICD-10-CM | POA: Insufficient documentation

## 2013-08-16 DIAGNOSIS — R944 Abnormal results of kidney function studies: Secondary | ICD-10-CM | POA: Insufficient documentation

## 2013-08-16 DIAGNOSIS — I4891 Unspecified atrial fibrillation: Secondary | ICD-10-CM | POA: Insufficient documentation

## 2013-08-16 DIAGNOSIS — M1710 Unilateral primary osteoarthritis, unspecified knee: Secondary | ICD-10-CM | POA: Insufficient documentation

## 2013-08-16 DIAGNOSIS — Z87891 Personal history of nicotine dependence: Secondary | ICD-10-CM | POA: Insufficient documentation

## 2013-08-16 LAB — TYPE AND SCREEN
ABO/RH(D): O POS
ANTIBODY SCREEN: NEGATIVE

## 2013-08-16 SURGERY — ARTHROPLASTY, KNEE, TOTAL
Anesthesia: General

## 2013-08-16 MED ORDER — SODIUM CHLORIDE 0.9 % IJ SOLN
INTRAMUSCULAR | Status: AC
  Filled 2013-08-16: qty 50

## 2013-08-16 MED ORDER — BUPIVACAINE LIPOSOME 1.3 % IJ SUSP
20.0000 mL | Freq: Once | INTRAMUSCULAR | Status: DC
  Filled 2013-08-16: qty 20

## 2013-08-16 MED ORDER — CEFAZOLIN SODIUM-DEXTROSE 2-3 GM-% IV SOLR: 2.0000 g | INTRAVENOUS | Status: DC

## 2013-08-16 MED ORDER — GLYCOPYRROLATE 0.2 MG/ML IJ SOLN
INTRAMUSCULAR | Status: AC
  Filled 2013-08-16: qty 2

## 2013-08-16 MED ORDER — EPHEDRINE SULFATE 50 MG/ML IJ SOLN
INTRAMUSCULAR | Status: AC
  Filled 2013-08-16: qty 1

## 2013-08-16 MED ORDER — LACTATED RINGERS IV SOLN
INTRAVENOUS | Status: AC | PRN
  Administered 2013-08-16 – 2013-10-10 (×3): via INTRAVENOUS

## 2013-08-16 MED ORDER — PROPOFOL 10 MG/ML IV BOLUS
INTRAVENOUS | Status: AC
  Filled 2013-08-16: qty 20

## 2013-08-16 MED ORDER — PHENYLEPHRINE 40 MCG/ML (10ML) SYRINGE FOR IV PUSH (FOR BLOOD PRESSURE SUPPORT)
PREFILLED_SYRINGE | INTRAVENOUS | Status: AC
  Filled 2013-08-16: qty 10

## 2013-08-16 MED ORDER — CEFAZOLIN SODIUM-DEXTROSE 2-3 GM-% IV SOLR
INTRAVENOUS | Status: AC
  Filled 2013-08-16: qty 50

## 2013-08-16 MED ORDER — SODIUM CHLORIDE 0.9 % IJ SOLN
INTRAMUSCULAR | Status: AC
  Filled 2013-08-16: qty 10

## 2013-08-16 MED ORDER — ONDANSETRON HCL 4 MG/2ML IJ SOLN
INTRAMUSCULAR | Status: AC
  Filled 2013-08-16: qty 2

## 2013-08-16 NOTE — Progress Notes (Signed)
Refer to preoperative evaluation note regarding cancellation of surgical procedure for abnormal renal function.

## 2013-09-26 NOTE — Progress Notes (Signed)
Need orders please - pt coming for preop 10/03/13 - thank you

## 2013-09-27 ENCOUNTER — Other Ambulatory Visit (HOSPITAL_COMMUNITY): Payer: Self-pay | Admitting: Orthopaedic Surgery

## 2013-10-01 ENCOUNTER — Encounter (HOSPITAL_COMMUNITY): Payer: Self-pay | Admitting: Pharmacy Technician

## 2013-10-03 ENCOUNTER — Encounter (HOSPITAL_COMMUNITY)
Admission: RE | Admit: 2013-10-03 | Discharge: 2013-10-03 | Disposition: A | Discharge status: Home / Self Care | Payer: Medicare Other | Source: Ambulatory Visit | Attending: Orthopaedic Surgery | Admitting: Orthopaedic Surgery

## 2013-10-03 ENCOUNTER — Encounter (INDEPENDENT_AMBULATORY_CARE_PROVIDER_SITE_OTHER): Payer: Self-pay

## 2013-10-03 ENCOUNTER — Encounter (HOSPITAL_COMMUNITY): Payer: Self-pay

## 2013-10-03 ENCOUNTER — Ambulatory Visit (HOSPITAL_COMMUNITY)
Admission: RE | Admit: 2013-10-03 | Discharge: 2013-10-03 | Disposition: A | Discharge status: Home / Self Care | Payer: Medicare Other | Source: Ambulatory Visit | Attending: Anesthesiology | Admitting: Anesthesiology

## 2013-10-03 DIAGNOSIS — Z01818 Encounter for other preprocedural examination: Secondary | ICD-10-CM | POA: Insufficient documentation

## 2013-10-03 DIAGNOSIS — Z01812 Encounter for preprocedural laboratory examination: Secondary | ICD-10-CM | POA: Insufficient documentation

## 2013-10-03 HISTORY — DX: Edema, unspecified: R60.9

## 2013-10-03 LAB — BASIC METABOLIC PANEL
BUN: 53 mg/dL — AB (ref 6–23)
CO2: 30 mEq/L (ref 19–32)
Calcium: 10.5 mg/dL (ref 8.4–10.5)
Chloride: 92 mEq/L — ABNORMAL LOW (ref 96–112)
Creatinine, Ser: 2.12 mg/dL — ABNORMAL HIGH (ref 0.50–1.35)
GFR, EST AFRICAN AMERICAN: 31 mL/min — AB (ref >90)
GFR, EST NON AFRICAN AMERICAN: 26 mL/min — AB (ref >90)
Glucose, Bld: 99 mg/dL (ref 70–99)
POTASSIUM: 4.4 meq/L (ref 3.7–5.3)
Sodium: 135 mEq/L — ABNORMAL LOW (ref 137–147)

## 2013-10-03 LAB — CBC
HCT: 29.2 % — ABNORMAL LOW (ref 39.0–52.0)
Hemoglobin: 9.7 g/dL — ABNORMAL LOW (ref 13.0–17.0)
MCH: 29.4 pg (ref 26.0–34.0)
MCHC: 33.2 g/dL (ref 30.0–36.0)
MCV: 88.5 fL (ref 78.0–100.0)
PLATELETS: 246 10*3/uL (ref 150–400)
RBC: 3.3 MIL/uL — ABNORMAL LOW (ref 4.22–5.81)
RDW: 14.1 % (ref 11.5–15.5)
WBC: 8 10*3/uL (ref 4.0–10.5)

## 2013-10-03 LAB — URINALYSIS, ROUTINE W REFLEX MICROSCOPIC
Bilirubin Urine: NEGATIVE
Glucose, UA: NEGATIVE mg/dL
Hgb urine dipstick: NEGATIVE
Ketones, ur: NEGATIVE mg/dL
Leukocytes, UA: NEGATIVE
Nitrite: NEGATIVE
PROTEIN: NEGATIVE mg/dL
Specific Gravity, Urine: 1.011 (ref 1.005–1.030)
UROBILINOGEN UA: 0.2 mg/dL (ref 0.0–1.0)
pH: 7 (ref 5.0–8.0)

## 2013-10-03 LAB — PROTIME-INR
INR: 0.95 (ref 0.00–1.49)
PROTHROMBIN TIME: 12.5 s (ref 11.6–15.2)

## 2013-10-03 LAB — SURGICAL PCR SCREEN
MRSA, PCR: NEGATIVE
STAPHYLOCOCCUS AUREUS: NEGATIVE

## 2013-10-03 LAB — APTT: aPTT: 30 seconds (ref 24–37)

## 2013-10-03 NOTE — Progress Notes (Signed)
10-03-13 1610 labs viewable in Epic done today.Marland Kitchen

## 2013-10-03 NOTE — Pre-Procedure Instructions (Addendum)
10-03-13 EKG 4'15-Epic. Cardiac Cath '11-Epic. CXR done today. 10-03-13 1610 note per Epic to Dr. Trevor Mace office- labs today viewable in [place].

## 2013-10-03 NOTE — Patient Instructions (Addendum)
[place]  10/03/2013   Your procedure is scheduled on:  6-11 -2015  Enter through [facility] Entrance and follow signs to [facility]. Arrive at    0530    AM .  Call this number if you have problems the morning of surgery: [phone]  Or Presurgical Testing [phone](Samarth Ogle) For Living Will and/or Health Care Power Attorney Forms: please provide copy for your medical record,may bring AM of surgery(Forms should be already notarized -we do not provide this service).(Yes/ No information preferred today).     Do not eat food:After Midnight.    Take these medicines the morning of surgery with A SIP OF WATER:  Only Tylenol. Hydrocodone. Stop Fish Oil, multivitamin, Do not apply any ointments or cream AM of.   Do not wear jewelry, make-up or nail polish.  Do not wear lotions, powders, or perfumes. You may wear deodorant.  Do not shave 48 hours(2 days) prior to first CHG shower(legs and under arms).(Shaving face and neck okay.)  Do not bring valuables to the hospital.(Hospital is not responsible for lost valuables).  Contacts, dentures or removable bridgework, body piercing, hair pins may not be worn into surgery.  Leave suitcase in the car. After surgery it may be brought to your room.  For patients admitted to the hospital, checkout time is 11:00 AM the day of discharge.(Restricted visitors-Any Persons displaying flu-like symptoms or illness).    Patients discharged the day of surgery will not be allowed to drive home. Must have responsible person with you x 24 hours once discharged.  Name and phone number of your driver: Hoyle Sauer [phone] cell  Special Instructions: CHG(Chlorhedine 4%-"Hibiclens","Betasept","Aplicare") Shower Use Special Wash: see special instructions.(avoid face and genitals)   Please read over the following fact sheets that you were given: MRSA Information, Blood Transfusion fact sheet.  Remember : Type/Screen "Blue armbands" -  may not be removed once applied(would result in being retested AM of surgery, if removed).  Failure to follow these instructions may result in Cancellation of your surgery.  ______________________________    [facility] - Preparing for Surgery Before surgery, you can play an important role.  Because skin is not sterile, your skin needs to be as free of germs as possible.  You can reduce the number of germs on your skin by washing with CHG (chlorahexidine gluconate) soap before surgery.  CHG is an antiseptic cleaner which kills germs and bonds with the skin to continue killing germs even after washing. Please DO NOT use if you have an allergy to CHG or antibacterial soaps.  If your skin becomes reddened/irritated stop using the CHG and inform your nurse when you arrive at Short Stay. Do not shave (including legs and underarms) for at least 48 hours prior to the first CHG shower.  You may shave your face/neck. Please follow these instructions carefully:  1.  Shower with CHG Soap the night before surgery and the  morning of Surgery.  2.  If you choose to wash your hair, wash your hair first as usual with your  normal  shampoo.  3.  After you shampoo, rinse your hair and body thoroughly to remove the  shampoo.                           4.  Use CHG as you would any other liquid soap.  You can apply chg directly  to the skin and wash  Gently with a scrungie or clean washcloth.  5.  Apply the CHG Soap to your body ONLY FROM THE NECK DOWN.   Do not use on face/ open                           Wound or open sores. Avoid contact with eyes, ears mouth and genitals (private parts).                       Wash face,  Genitals (private parts) with your normal soap.             6.  Wash thoroughly, paying special attention to the area where your surgery  will be performed.  7.  Thoroughly rinse your body with warm water from the neck down.  8.  DO NOT shower/wash with your normal soap after  using and rinsing off  the CHG Soap.                9.  Pat yourself dry with a clean towel.            10.  Wear clean pajamas.            11.  Place clean sheets on your bed the night of your first shower and do not  sleep with pets. Day of Surgery : Do not apply any lotions/deodorants the morning of surgery.  Please wear clean clothes to the hospital/surgery center.  FAILURE TO FOLLOW THESE INSTRUCTIONS MAY RESULT IN THE CANCELLATION OF YOUR SURGERY PATIENT SIGNATURE_________________________________  NURSE SIGNATURE__________________________________  ________________________________________________________________________  WHAT IS A BLOOD TRANSFUSION? Blood Transfusion Information  A transfusion is the replacement of blood or some of its parts. Blood is made up of multiple cells which provide different functions.  Red blood cells carry oxygen and are used for blood loss replacement.  White blood cells fight against infection.  Platelets control bleeding.  Plasma helps clot blood.  Other blood products are available for specialized needs, such as hemophilia or other clotting disorders. BEFORE THE TRANSFUSION  Who gives blood for transfusions?   Healthy volunteers who are fully evaluated to make sure their blood is safe. This is blood bank blood. Transfusion therapy is the safest it has ever been in the practice of medicine. Before blood is taken from a donor, a complete history is taken to make sure that person has no history of diseases nor engages in risky social behavior (examples are intravenous drug use or sexual activity with multiple partners). The donor's travel history is screened to minimize risk of transmitting infections, such as malaria. The donated blood is tested for signs of infectious diseases, such as HIV and hepatitis. The blood is then tested to be sure it is compatible with you in order to minimize the chance of a transfusion reaction. If you or a relative  donates blood, this is often done in anticipation of surgery and is not appropriate for emergency situations. It takes many days to process the donated blood. RISKS AND COMPLICATIONS Although transfusion therapy is very safe and saves many lives, the main dangers of transfusion include:   Getting an infectious disease.  Developing a transfusion reaction. This is an allergic reaction to something in the blood you were given. Every precaution is taken to prevent this. The decision to have a blood transfusion has been considered carefully by your caregiver before blood is given. Blood is not given unless the benefits  outweigh the risks. AFTER THE TRANSFUSION  Right after receiving a blood transfusion, you will usually feel much better and more energetic. This is especially true if your red blood cells have gotten low (anemic). The transfusion raises the level of the red blood cells which carry oxygen, and this usually causes an energy increase.  The nurse administering the transfusion will monitor you carefully for complications. HOME CARE INSTRUCTIONS  No special instructions are needed after a transfusion. You may find your energy is better. Speak with your caregiver about any limitations on activity for underlying diseases you may have. SEEK MEDICAL CARE IF:   Your condition is not improving after your transfusion.  You develop redness or irritation at the intravenous (IV) site. SEEK IMMEDIATE MEDICAL CARE IF:  Any of the following symptoms occur over the next 12 hours:  Shaking chills.  You have a temperature by mouth above 102 F (38.9 C), not controlled by medicine.  Chest, back, or muscle pain.  People around you feel you are not acting correctly or are confused.  Shortness of breath or difficulty breathing.  Dizziness and fainting.  You get a rash or develop hives.  You have a decrease in urine output.  Your urine turns a dark color or changes to pink, red, or brown. Any  of the following symptoms occur over the next 10 days:  You have a temperature by mouth above 102 F (38.9 C), not controlled by medicine.  Shortness of breath.  Weakness after normal activity.  The white part of the eye turns yellow (jaundice).  You have a decrease in the amount of urine or are urinating less often.  Your urine turns a dark color or changes to pink, red, or brown. Document Released: 04/15/2000 Document Revised: 07/11/2011 Document Reviewed: 12/03/2007 [facility] Patient Information 2014 [place], [place].  _______________________________________________________________________

## 2013-10-09 NOTE — Anesthesia Preprocedure Evaluation (Addendum)
Anesthesia Evaluation  Patient identified by MRN, date of birth, ID band Patient awake    Reviewed: Allergy & Precautions, H&P , NPO status , Patient's Chart, lab work & pertinent test results  Airway Mallampati: II TM Distance: >3 FB Neck ROM: full    Dental  (+) Edentulous Upper, Edentulous Lower, Dental Advisory Given   Pulmonary neg pulmonary ROS, former smoker,  breath sounds clear to auscultation  Pulmonary exam normal       Cardiovascular Exercise Tolerance: Good hypertension, Pt. on medications + dysrhythmias Atrial Fibrillation Rhythm:regular Rate:Normal  RBBB   Neuro/Psych negative neurological ROS  negative psych ROS   GI/Hepatic negative GI ROS, Neg liver ROS,   Endo/Other  negative endocrine ROS  Renal/GU Renal InsufficiencyRenal diseaseCRT 2.12 on 10/03/13  BUN 53  negative genitourinary   Musculoskeletal   Abdominal   Peds  Hematology negative hematology ROS (+)   Anesthesia Other Findings   Reproductive/Obstetrics negative OB ROS                        Anesthesia Physical Anesthesia Plan  ASA: III  Anesthesia Plan: Spinal   Post-op Pain Management:    Induction:   Airway Management Planned:   Additional Equipment:   Intra-op Plan:   Post-operative Plan:   Informed Consent: I have reviewed the patients History and Physical, chart, labs and discussed the procedure including the risks, benefits and alternatives for the proposed anesthesia with the patient or authorized representative who has indicated his/her understanding and acceptance.   Dental Advisory Given  Plan Discussed with: CRNA and Surgeon  Anesthesia Plan Comments:         Anesthesia Quick Evaluation

## 2013-10-10 ENCOUNTER — Inpatient Hospital Stay (HOSPITAL_COMMUNITY)
Admission: RE | Admit: 2013-10-10 | Discharge: 2013-10-14 | DRG: 470 | Disposition: A | Discharge status: Skilled Nursing Facility | Payer: Medicare Other | Source: Ambulatory Visit | Attending: Orthopaedic Surgery | Admitting: Orthopaedic Surgery

## 2013-10-10 ENCOUNTER — Inpatient Hospital Stay (HOSPITAL_COMMUNITY): Payer: Medicare Other

## 2013-10-10 ENCOUNTER — Encounter (HOSPITAL_COMMUNITY): Payer: Self-pay | Admitting: *Deleted

## 2013-10-10 ENCOUNTER — Encounter (HOSPITAL_COMMUNITY): Payer: Medicare Other | Admitting: Anesthesiology

## 2013-10-10 ENCOUNTER — Inpatient Hospital Stay (HOSPITAL_COMMUNITY): Payer: Medicare Other | Admitting: Anesthesiology

## 2013-10-10 ENCOUNTER — Encounter (HOSPITAL_COMMUNITY)
Admission: RE | Disposition: A | Discharge status: Skilled Nursing Facility | Payer: Self-pay | Source: Ambulatory Visit | Attending: Orthopaedic Surgery

## 2013-10-10 DIAGNOSIS — M2540 Effusion, unspecified joint: Secondary | ICD-10-CM | POA: Diagnosis present

## 2013-10-10 DIAGNOSIS — Z79899 Other long term (current) drug therapy: Secondary | ICD-10-CM

## 2013-10-10 DIAGNOSIS — M1710 Unilateral primary osteoarthritis, unspecified knee: Principal | ICD-10-CM | POA: Diagnosis present

## 2013-10-10 DIAGNOSIS — N179 Acute kidney failure, unspecified: Secondary | ICD-10-CM | POA: Diagnosis not present

## 2013-10-10 DIAGNOSIS — Z96649 Presence of unspecified artificial hip joint: Secondary | ICD-10-CM

## 2013-10-10 DIAGNOSIS — Z886 Allergy status to analgesic agent status: Secondary | ICD-10-CM

## 2013-10-10 DIAGNOSIS — R609 Edema, unspecified: Secondary | ICD-10-CM | POA: Diagnosis present

## 2013-10-10 DIAGNOSIS — M169 Osteoarthritis of hip, unspecified: Secondary | ICD-10-CM | POA: Diagnosis present

## 2013-10-10 DIAGNOSIS — Z87891 Personal history of nicotine dependence: Secondary | ICD-10-CM

## 2013-10-10 DIAGNOSIS — M1711 Unilateral primary osteoarthritis, right knee: Secondary | ICD-10-CM

## 2013-10-10 DIAGNOSIS — N184 Chronic kidney disease, stage 4 (severe): Secondary | ICD-10-CM | POA: Diagnosis present

## 2013-10-10 DIAGNOSIS — Z9849 Cataract extraction status, unspecified eye: Secondary | ICD-10-CM

## 2013-10-10 DIAGNOSIS — D62 Acute posthemorrhagic anemia: Secondary | ICD-10-CM | POA: Diagnosis not present

## 2013-10-10 DIAGNOSIS — I129 Hypertensive chronic kidney disease with stage 1 through stage 4 chronic kidney disease, or unspecified chronic kidney disease: Secondary | ICD-10-CM | POA: Diagnosis present

## 2013-10-10 DIAGNOSIS — N189 Chronic kidney disease, unspecified: Secondary | ICD-10-CM

## 2013-10-10 DIAGNOSIS — M1610 Unilateral primary osteoarthritis, unspecified hip: Secondary | ICD-10-CM | POA: Diagnosis present

## 2013-10-10 DIAGNOSIS — M109 Gout, unspecified: Secondary | ICD-10-CM | POA: Diagnosis present

## 2013-10-10 DIAGNOSIS — I4510 Unspecified right bundle-branch block: Secondary | ICD-10-CM | POA: Diagnosis present

## 2013-10-10 DIAGNOSIS — Z96659 Presence of unspecified artificial knee joint: Secondary | ICD-10-CM

## 2013-10-10 HISTORY — PX: TOTAL KNEE ARTHROPLASTY: SHX125

## 2013-10-10 SURGERY — ARTHROPLASTY, KNEE, TOTAL
Anesthesia: Spinal | Site: Knee | Laterality: Right

## 2013-10-10 MED ORDER — ADULT MULTIVITAMIN W/MINERALS CH
1.0000 | ORAL_TABLET | Freq: Every morning | ORAL | Status: DC
  Administered 2013-10-10 – 2013-10-14 (×5): 1 via ORAL
  Filled 2013-10-10 (×5): qty 1

## 2013-10-10 MED ORDER — HYDROMORPHONE HCL PF 1 MG/ML IJ SOLN
1.0000 mg | INTRAMUSCULAR | Status: DC | PRN
  Administered 2013-10-10 (×2): 0.5 mg via INTRAVENOUS
  Filled 2013-10-10: qty 1

## 2013-10-10 MED ORDER — BUPIVACAINE LIPOSOME 1.3 % IJ SUSP
20.0000 mL | Freq: Once | INTRAMUSCULAR | Status: DC
  Filled 2013-10-10: qty 20

## 2013-10-10 MED ORDER — ALLOPURINOL 100 MG PO TABS
100.0000 mg | ORAL_TABLET | Freq: Every evening | ORAL | Status: DC
  Administered 2013-10-10 – 2013-10-13 (×4): 100 mg via ORAL
  Filled 2013-10-10 (×5): qty 1

## 2013-10-10 MED ORDER — OXYCODONE HCL 5 MG PO TABS
5.0000 mg | ORAL_TABLET | ORAL | Status: DC | PRN
  Administered 2013-10-10 (×2): 10 mg via ORAL
  Administered 2013-10-10 (×2): 5 mg via ORAL
  Administered 2013-10-11 (×2): 10 mg via ORAL
  Filled 2013-10-10 (×2): qty 1
  Filled 2013-10-10 (×4): qty 2

## 2013-10-10 MED ORDER — PROPOFOL 10 MG/ML IV BOLUS
INTRAVENOUS | Status: AC
  Filled 2013-10-10: qty 20

## 2013-10-10 MED ORDER — FUROSEMIDE 40 MG PO TABS
40.0000 mg | ORAL_TABLET | Freq: Every morning | ORAL | Status: DC
  Administered 2013-10-10 – 2013-10-14 (×5): 40 mg via ORAL
  Filled 2013-10-10 (×5): qty 1

## 2013-10-10 MED ORDER — FENTANYL CITRATE 0.05 MG/ML IJ SOLN
INTRAMUSCULAR | Status: AC
  Filled 2013-10-10: qty 2

## 2013-10-10 MED ORDER — COUMADIN BOOK
Freq: Once | Status: AC
  Administered 2013-10-10: 18:00:00
  Filled 2013-10-10: qty 1

## 2013-10-10 MED ORDER — LACTATED RINGERS IV SOLN: INTRAVENOUS | Status: DC

## 2013-10-10 MED ORDER — POLYETHYLENE GLYCOL 3350 17 G PO PACK
17.0000 g | PACK | Freq: Every day | ORAL | Status: DC | PRN
  Administered 2013-10-12 – 2013-10-13 (×2): 17 g via ORAL

## 2013-10-10 MED ORDER — LIDOCAINE HCL 1 % IJ SOLN
INTRAMUSCULAR | Status: AC
  Filled 2013-10-10: qty 20

## 2013-10-10 MED ORDER — HYDROCHLOROTHIAZIDE 12.5 MG PO CAPS
12.5000 mg | ORAL_CAPSULE | Freq: Every day | ORAL | Status: DC
  Administered 2013-10-10 – 2013-10-12 (×3): 12.5 mg via ORAL
  Filled 2013-10-10 (×3): qty 1

## 2013-10-10 MED ORDER — CEFAZOLIN SODIUM-DEXTROSE 2-3 GM-% IV SOLR
INTRAVENOUS | Status: AC
  Filled 2013-10-10: qty 50

## 2013-10-10 MED ORDER — MIDAZOLAM HCL 5 MG/5ML IJ SOLN
INTRAMUSCULAR | Status: DC | PRN
  Administered 2013-10-10: 2 mg via INTRAVENOUS

## 2013-10-10 MED ORDER — FERROUS SULFATE 325 (65 FE) MG PO TABS
325.0000 mg | ORAL_TABLET | Freq: Every day | ORAL | Status: DC
  Administered 2013-10-11 – 2013-10-14 (×4): 325 mg via ORAL
  Filled 2013-10-10 (×6): qty 1

## 2013-10-10 MED ORDER — ACETAMINOPHEN 325 MG PO TABS
650.0000 mg | ORAL_TABLET | Freq: Four times a day (QID) | ORAL | Status: DC | PRN
  Administered 2013-10-12 – 2013-10-13 (×6): 650 mg via ORAL
  Filled 2013-10-10 (×6): qty 2

## 2013-10-10 MED ORDER — HYDROMORPHONE HCL PF 1 MG/ML IJ SOLN
1.0000 mg | INTRAMUSCULAR | Status: DC | PRN
  Administered 2013-10-10 (×2): 1 mg via INTRAVENOUS
  Filled 2013-10-10 (×3): qty 1

## 2013-10-10 MED ORDER — BUPIVACAINE HCL (PF) 0.75 % IJ SOLN
INTRAMUSCULAR | Status: DC | PRN
  Administered 2013-10-10: 2 mL

## 2013-10-10 MED ORDER — WARFARIN SODIUM 5 MG PO TABS
5.0000 mg | ORAL_TABLET | Freq: Once | ORAL | Status: AC
  Administered 2013-10-10: 5 mg via ORAL
  Filled 2013-10-10: qty 1

## 2013-10-10 MED ORDER — PROPOFOL INFUSION 10 MG/ML OPTIME
INTRAVENOUS | Status: DC | PRN
  Administered 2013-10-10: 75 ug/kg/min via INTRAVENOUS

## 2013-10-10 MED ORDER — OLMESARTAN MEDOXOMIL-HCTZ 40-12.5 MG PO TABS: 1.0000 | ORAL_TABLET | Freq: Every morning | ORAL | Status: DC

## 2013-10-10 MED ORDER — CEFAZOLIN SODIUM 1-5 GM-% IV SOLN
1.0000 g | Freq: Four times a day (QID) | INTRAVENOUS | Status: AC
  Administered 2013-10-10 (×2): 1 g via INTRAVENOUS
  Filled 2013-10-10 (×2): qty 50

## 2013-10-10 MED ORDER — PROPOFOL 10 MG/ML IV BOLUS
INTRAVENOUS | Status: AC
Start: 2013-10-10 — End: 2013-10-10
  Filled 2013-10-10: qty 20

## 2013-10-10 MED ORDER — WARFARIN VIDEO: Freq: Once | Status: DC

## 2013-10-10 MED ORDER — METOCLOPRAMIDE HCL 5 MG/ML IJ SOLN: 5.0000 mg | Freq: Three times a day (TID) | INTRAMUSCULAR | Status: DC | PRN

## 2013-10-10 MED ORDER — ALUM & MAG HYDROXIDE-SIMETH 200-200-20 MG/5ML PO SUSP: 30.0000 mL | ORAL | Status: DC | PRN

## 2013-10-10 MED ORDER — ONDANSETRON HCL 4 MG PO TABS: 4.0000 mg | ORAL_TABLET | Freq: Four times a day (QID) | ORAL | Status: DC | PRN

## 2013-10-10 MED ORDER — ONDANSETRON HCL 4 MG/2ML IJ SOLN
4.0000 mg | Freq: Four times a day (QID) | INTRAMUSCULAR | Status: DC | PRN
  Administered 2013-10-10: 4 mg via INTRAVENOUS
  Filled 2013-10-10: qty 2

## 2013-10-10 MED ORDER — CEFAZOLIN SODIUM-DEXTROSE 2-3 GM-% IV SOLR
2.0000 g | INTRAVENOUS | Status: AC
  Administered 2013-10-10: 2 g via INTRAVENOUS

## 2013-10-10 MED ORDER — DOCUSATE SODIUM 100 MG PO CAPS
100.0000 mg | ORAL_CAPSULE | Freq: Two times a day (BID) | ORAL | Status: DC
  Administered 2013-10-10 – 2013-10-14 (×8): 100 mg via ORAL

## 2013-10-10 MED ORDER — MIDAZOLAM HCL 2 MG/2ML IJ SOLN
INTRAMUSCULAR | Status: AC
  Filled 2013-10-10: qty 2

## 2013-10-10 MED ORDER — OXYCODONE HCL ER 20 MG PO T12A
20.0000 mg | EXTENDED_RELEASE_TABLET | Freq: Two times a day (BID) | ORAL | Status: DC
  Administered 2013-10-10 – 2013-10-11 (×3): 20 mg via ORAL
  Filled 2013-10-10 (×3): qty 1

## 2013-10-10 MED ORDER — METHOCARBAMOL 500 MG PO TABS
500.0000 mg | ORAL_TABLET | Freq: Four times a day (QID) | ORAL | Status: DC | PRN
  Administered 2013-10-11 – 2013-10-13 (×6): 500 mg via ORAL
  Filled 2013-10-10 (×7): qty 1

## 2013-10-10 MED ORDER — PHENOL 1.4 % MT LIQD: 1.0000 | OROMUCOSAL | Status: DC | PRN

## 2013-10-10 MED ORDER — SIMVASTATIN 10 MG PO TABS
10.0000 mg | ORAL_TABLET | Freq: Every day | ORAL | Status: DC
  Administered 2013-10-10 – 2013-10-13 (×4): 10 mg via ORAL
  Filled 2013-10-10 (×5): qty 1

## 2013-10-10 MED ORDER — WARFARIN - PHARMACIST DOSING INPATIENT
Freq: Every day | Status: DC
  Administered 2013-10-10: 18:00:00

## 2013-10-10 MED ORDER — PHENYLEPHRINE HCL 10 MG/ML IJ SOLN
INTRAMUSCULAR | Status: DC | PRN
  Administered 2013-10-10: 80 ug via INTRAVENOUS

## 2013-10-10 MED ORDER — IRBESARTAN 300 MG PO TABS
300.0000 mg | ORAL_TABLET | Freq: Every day | ORAL | Status: DC
  Administered 2013-10-10 – 2013-10-13 (×4): 300 mg via ORAL
  Filled 2013-10-10 (×4): qty 1

## 2013-10-10 MED ORDER — FENTANYL CITRATE 0.05 MG/ML IJ SOLN
INTRAMUSCULAR | Status: DC | PRN
  Administered 2013-10-10 (×2): 50 ug via INTRAVENOUS

## 2013-10-10 MED ORDER — METOCLOPRAMIDE HCL 10 MG PO TABS: 5.0000 mg | ORAL_TABLET | Freq: Three times a day (TID) | ORAL | Status: DC | PRN

## 2013-10-10 MED ORDER — TRANEXAMIC ACID 100 MG/ML IV SOLN
1000.0000 mg | INTRAVENOUS | Status: AC
  Administered 2013-10-10: 1000 mg via INTRAVENOUS
  Filled 2013-10-10: qty 10

## 2013-10-10 MED ORDER — METHOCARBAMOL 1000 MG/10ML IJ SOLN
500.0000 mg | Freq: Four times a day (QID) | INTRAVENOUS | Status: DC | PRN
  Administered 2013-10-10: 500 mg via INTRAVENOUS
  Filled 2013-10-10 (×2): qty 5

## 2013-10-10 MED ORDER — HYDROMORPHONE HCL PF 1 MG/ML IJ SOLN: 0.2500 mg | INTRAMUSCULAR | Status: DC | PRN

## 2013-10-10 MED ORDER — SODIUM CHLORIDE 0.9 % IV SOLN
INTRAVENOUS | Status: DC
  Administered 2013-10-10 – 2013-10-11 (×2): via INTRAVENOUS

## 2013-10-10 MED ORDER — SODIUM CHLORIDE 0.9 % IJ SOLN
INTRAMUSCULAR | Status: AC
  Filled 2013-10-10: qty 50

## 2013-10-10 MED ORDER — MENTHOL 3 MG MT LOZG: 1.0000 | LOZENGE | OROMUCOSAL | Status: DC | PRN

## 2013-10-10 MED ORDER — BUPIVACAINE LIPOSOME 1.3 % IJ SUSP: INTRAMUSCULAR | Status: DC | PRN

## 2013-10-10 MED ORDER — TAMSULOSIN HCL 0.4 MG PO CAPS
0.4000 mg | ORAL_CAPSULE | Freq: Every day | ORAL | Status: DC
  Administered 2013-10-10 – 2013-10-13 (×4): 0.4 mg via ORAL
  Filled 2013-10-10 (×5): qty 1

## 2013-10-10 MED ORDER — ACETAMINOPHEN 650 MG RE SUPP: 650.0000 mg | Freq: Four times a day (QID) | RECTAL | Status: DC | PRN

## 2013-10-10 MED ORDER — SODIUM CHLORIDE 0.9 % IR SOLN
Status: DC | PRN
  Administered 2013-10-10: 1000 mL

## 2013-10-10 SURGICAL SUPPLY — 68 items
APL SKNCLS STERI-STRIP NONHPOA (GAUZE/BANDAGES/DRESSINGS)
BAG SPEC THK2 15X12 ZIP CLS (MISCELLANEOUS)
BAG ZIPLOCK 12X15 (MISCELLANEOUS) IMPLANT
BANDAGE ELASTIC 6 VELCRO ST LF (GAUZE/BANDAGES/DRESSINGS) ×4 IMPLANT
BANDAGE ESMARK 6X9 LF (GAUZE/BANDAGES/DRESSINGS) ×1 IMPLANT
BENZOIN TINCTURE PRP APPL 2/3 (GAUZE/BANDAGES/DRESSINGS) IMPLANT
BLADE SAG 18X100X1.27 (BLADE) ×3 IMPLANT
BNDG CMPR 9X6 STRL LF SNTH (GAUZE/BANDAGES/DRESSINGS) ×1
BNDG ESMARK 6X9 LF (GAUZE/BANDAGES/DRESSINGS) ×3
BOWL SMART MIX CTS (DISPOSABLE) ×3 IMPLANT
CEMENT BONE 1-PACK (Cement) ×6 IMPLANT
CLOSURE WOUND 1/2 X4 (GAUZE/BANDAGES/DRESSINGS)
CUFF TOURN SGL QUICK 34 (TOURNIQUET CUFF) ×3
CUFF TRNQT CYL 34X4X40X1 (TOURNIQUET CUFF) ×1 IMPLANT
DRAPE EXTREMITY T 121X128X90 (DRAPE) ×3 IMPLANT
DRAPE LG THREE QUARTER DISP (DRAPES) ×3 IMPLANT
DRAPE POUCH INSTRU U-SHP 10X18 (DRAPES) ×3 IMPLANT
DRAPE U-SHAPE 47X51 STRL (DRAPES) ×3 IMPLANT
DRSG AQUACEL AG ADV 3.5X10 (GAUZE/BANDAGES/DRESSINGS) IMPLANT
DRSG PAD ABDOMINAL 8X10 ST (GAUZE/BANDAGES/DRESSINGS) ×2 IMPLANT
DRSG TEGADERM 4X4.75 (GAUZE/BANDAGES/DRESSINGS) IMPLANT
DURAPREP 26ML APPLICATOR (WOUND CARE) ×3 IMPLANT
ELECT REM PT RETURN 9FT ADLT (ELECTROSURGICAL) ×3
ELECTRODE REM PT RTRN 9FT ADLT (ELECTROSURGICAL) ×1 IMPLANT
EVACUATOR 1/8 PVC DRAIN (DRAIN) ×3 IMPLANT
FACESHIELD WRAPAROUND (MASK) ×15 IMPLANT
FACESHIELD WRAPAROUND OR TEAM (MASK) ×5 IMPLANT
GAUZE SPONGE 2X2 8PLY STRL LF (GAUZE/BANDAGES/DRESSINGS) IMPLANT
GAUZE SPONGE 4X4 12PLY STRL (GAUZE/BANDAGES/DRESSINGS) IMPLANT
GAUZE XEROFORM 1X8 LF (GAUZE/BANDAGES/DRESSINGS) IMPLANT
GAUZE XEROFORM 5X9 LF (GAUZE/BANDAGES/DRESSINGS) ×2 IMPLANT
GLOVE BIO SURGEON STRL SZ7.5 (GLOVE) ×3 IMPLANT
GLOVE BIOGEL PI IND STRL 8 (GLOVE) ×2 IMPLANT
GLOVE BIOGEL PI INDICATOR 8 (GLOVE) ×4
GLOVE ECLIPSE 8.0 STRL XLNG CF (GLOVE) ×3 IMPLANT
GOWN STRL REUS W/TWL XL LVL3 (GOWN DISPOSABLE) ×6 IMPLANT
HANDPIECE INTERPULSE COAX TIP (DISPOSABLE) ×3
IMMOBILIZER KNEE 20 (SOFTGOODS) ×5 IMPLANT
IMMOBILIZER KNEE 20 THIGH 36 (SOFTGOODS) ×1 IMPLANT
KIT BASIN OR (CUSTOM PROCEDURE TRAY) ×3 IMPLANT
KNEE/VIT E POLY LINER LEVEL 1B ×2 IMPLANT
NDL HYPO 21X1.5 SAFETY (NEEDLE) IMPLANT
NEEDLE HYPO 21X1.5 SAFETY (NEEDLE) IMPLANT
NS IRRIG 1000ML POUR BTL (IV SOLUTION) ×3 IMPLANT
PACK TOTAL JOINT (CUSTOM PROCEDURE TRAY) ×3 IMPLANT
PAD ABD 8X10 STRL (GAUZE/BANDAGES/DRESSINGS) ×2 IMPLANT
PADDING CAST COTTON 6X4 STRL (CAST SUPPLIES) ×2 IMPLANT
POSITIONER SURGICAL ARM (MISCELLANEOUS) ×3 IMPLANT
SET HNDPC FAN SPRY TIP SCT (DISPOSABLE) ×1 IMPLANT
SET PAD KNEE POSITIONER (MISCELLANEOUS) ×3 IMPLANT
SPONGE GAUZE 2X2 STER 10/PKG (GAUZE/BANDAGES/DRESSINGS)
SPONGE GAUZE 4X4 12PLY (GAUZE/BANDAGES/DRESSINGS) ×2 IMPLANT
STAPLER VISISTAT 35W (STAPLE) ×2 IMPLANT
STRIP CLOSURE SKIN 1/2X4 (GAUZE/BANDAGES/DRESSINGS) IMPLANT
SUCTION FRAZIER 12FR DISP (SUCTIONS) ×3 IMPLANT
SUT MNCRL AB 4-0 PS2 18 (SUTURE) ×2 IMPLANT
SUT VIC AB 0 CT1 27 (SUTURE)
SUT VIC AB 0 CT1 27XBRD ANTBC (SUTURE) IMPLANT
SUT VIC AB 1 CT1 27 (SUTURE) ×9
SUT VIC AB 1 CT1 27XBRD ANTBC (SUTURE) ×3 IMPLANT
SUT VIC AB 2-0 CT1 27 (SUTURE) ×6
SUT VIC AB 2-0 CT1 TAPERPNT 27 (SUTURE) ×2 IMPLANT
SYR 50ML LL SCALE MARK (SYRINGE) IMPLANT
TOWEL OR 17X26 10 PK STRL BLUE (TOWEL DISPOSABLE) ×3 IMPLANT
TOWEL OR NON WOVEN STRL DISP B (DISPOSABLE) IMPLANT
TRAY FOLEY CATH 14FRSI W/METER (CATHETERS) ×3 IMPLANT
WATER STERILE IRR 1500ML POUR (IV SOLUTION) ×3 IMPLANT
WRAP KNEE MAXI GEL POST OP (GAUZE/BANDAGES/DRESSINGS) ×3 IMPLANT

## 2013-10-10 NOTE — Transfer of Care (Signed)
Immediate Anesthesia Transfer of Care Note  Patient: [facility]  Procedure(s) Performed: Procedure(s) (LRB): RIGHT TOTAL KNEE ARTHROPLASTY (Right)  Patient Location: PACU  Anesthesia Type: Spinal  Level of Consciousness: sedated, patient cooperative and responds to stimulation  Airway & Oxygen Therapy: Patient Spontanous Breathing and Patient connected to face mask oxgen  Post-op Assessment: Report given to PACU RN and Post -op Vital signs reviewed and stable  Post vital signs: Reviewed and stable  Complications: No apparent anesthesia complications

## 2013-10-10 NOTE — H&P (Signed)
TOTAL KNEE ADMISSION H&P  Patient is being admitted for right total knee arthroplasty.  Subjective:  Chief Complaint:right knee pain.  HPI: [place], 80 y.o. male, has a history of pain and functional disability in the right knee due to arthritis and has failed non-surgical conservative treatments for greater than 12 weeks to includeNSAID's and/or analgesics, corticosteriod injections, flexibility and strengthening excercises, supervised PT with diminished ADL's post treatment, use of assistive devices and activity modification.  Onset of symptoms was gradual, starting 5 years ago with gradually worsening course since that time. The patient noted no past surgery on the right knee(s).  Patient currently rates pain in the right knee(s) at 10 out of 10 with activity. Patient has night pain, worsening of pain with activity and weight bearing, pain that interferes with activities of daily living, pain with passive range of motion, crepitus and joint swelling.  Patient has evidence of subchondral sclerosis, periarticular osteophytes and joint space narrowing by imaging studies. There is no active infection.  Patient Active Problem List   Diagnosis Date Noted  . Arthritis of knee, right 10/10/2013  . Degenerative arthritis of hip 06/29/2012  . TOTAL KNEE REPLACEMENT, LEFT, HX OF 04/12/2010  . RBBB 02/18/2010  . NONSPECIFIC ABNORMAL UNSPEC CV FUNCTION STUDY 02/18/2010  . ATRIAL FLUTTER 02/10/2010  . GOUT 02/09/2010  . HYPERTENSION 02/09/2010   Past Medical History  Diagnosis Date  . Hypertension   . Cancer 06-19-12    Hx. 15 yrs ago -seed implant-PSA remains elevated somewhat.  . Arthritis     Osteoarthritis(right shoulder)-knees, hip . s/p LTKA  . Transfusion history 06-19-12    history s/p '11- LTKA  . Gout   . Dysrhythmia 06-19-12    medical record note shows hx R)BBB-. A.Fib/ flutter-not present  . Frequency of urination   . Anemia   . Swelling     10-03-13 both legs swells, right is  greater    Past Surgical History  Procedure Laterality Date  . Joint replacement  06-19-12    '11 -LTKA  . Cataract extraction, bilateral    . Minor hemorrhoidectomy    . Radioactive seed implant  06-19-12    many yrs ago > 15 yrs  . Total hip arthroplasty Left 06/29/2012    Procedure: LEFT TOTAL HIP ARTHROPLASTY ANTERIOR APPROACH;  Surgeon: Mcarthur Rossetti, MD;  Location: [facility] ORS;  Service: Orthopedics;  Laterality: Left;  Left Total Hip Arthroplasty, Anterior Approach    Prescriptions prior to admission  Medication Sig Dispense Refill  . acetaminophen (TYLENOL) 500 MG tablet Take 500 mg by mouth every 6 (six) hours as needed for moderate pain.      . furosemide (LASIX) 40 MG tablet Take 40 mg by mouth every morning.      Marland Kitchen HYDROcodone-acetaminophen (NORCO/VICODIN) 5-325 MG per tablet Take 1 tablet by mouth every 6 (six) hours as needed for moderate pain.      Marland Kitchen allopurinol (ZYLOPRIM) 100 MG tablet Take 100 mg by mouth every evening.       . clobetasol cream (TEMOVATE) 1.95 % Apply 1 application topically 2 (two) times daily. Applying to both legs x 2 daily for swelling      . ferrous sulfate 325 (65 FE) MG tablet Take 325 mg by mouth daily with breakfast.      . Multiple Vitamin (MULTIVITAMIN WITH MINERALS) TABS Take 1 tablet by mouth every morning.      . olmesartan-hydrochlorothiazide (BENICAR HCT) 40-12.5 MG per tablet Take 1 tablet by  mouth every morning.      . Omega-3 Fatty Acids (FISH OIL) 1200 MG CAPS Take 1,200 mg by mouth at bedtime. Includes 360mg  of Omega-3.      . pravastatin (PRAVACHOL) 20 MG tablet Take 20 mg by mouth at bedtime.      . Tamsulosin HCl (FLOMAX) 0.4 MG CAPS Take 0.4 mg by mouth at bedtime.       Allergies  Allergen Reactions  . Aspirin Other (See Comments)    bleeding    History  Substance Use Topics  . Smoking status: Former Smoker    Types: Cigarettes    Quit date: 06/19/1980  . Smokeless tobacco: Not on file  . Alcohol Use: 1.8 oz/week    3  Shots of liquor per week     Comment: 10 -12 ounces daily    History reviewed. No pertinent family history.   Review of Systems  Musculoskeletal: Positive for joint pain.  All other systems reviewed and are negative.   Objective:  Physical Exam  Constitutional: He is oriented to person, place, and time. He appears well-developed and well-nourished.  HENT:  Head: Normocephalic and atraumatic.  Eyes: Conjunctivae are normal. Pupils are equal, round, and reactive to light.  Neck: Normal range of motion. Neck supple.  Cardiovascular: Normal rate.   Respiratory: Effort normal and breath sounds normal.  GI: Soft. Bowel sounds are normal.  Musculoskeletal:       Right knee: He exhibits decreased range of motion, effusion and abnormal alignment. Tenderness found. Medial joint line and lateral joint line tenderness noted.  Neurological: He is alert and oriented to person, place, and time.  Skin: Skin is warm and dry.  Psychiatric: He has a normal mood and affect.    Vital signs in last 24 hours: Temp:  [98.8 F (37.1 C)] 98.8 F (37.1 C) (06/11 0538) Pulse Rate:  [96] 96 (06/11 0538) Resp:  [18] 18 (06/11 0538) BP: (162)/(78) 162/78 mmHg (06/11 0538) SpO2:  [98 %] 98 % (06/11 0538)  Labs:   Estimated body mass index is 33.86 kg/(m^2) as calculated from the following:   Height as of 10/03/13: 5\' 5"  (1.651 m).   Weight as of 08/08/13: 92.307 kg (203 lb 8 oz).   Imaging Review Plain radiographs demonstrate severe degenerative joint disease of the right knee(s). The overall alignment ismild varus. The bone quality appears to be good for age and reported activity level.  Assessment/Plan:  End stage arthritis, right knee   The patient history, physical examination, clinical judgment of the provider and imaging studies are consistent with end stage degenerative joint disease of the right knee(s) and total knee arthroplasty is deemed medically necessary. The treatment options including  medical management, injection therapy arthroscopy and arthroplasty were discussed at length. The risks and benefits of total knee arthroplasty were presented and reviewed. The risks due to aseptic loosening, infection, stiffness, patella tracking problems, thromboembolic complications and other imponderables were discussed. The patient acknowledged the explanation, agreed to proceed with the plan and consent was signed. Patient is being admitted for inpatient treatment for surgery, pain control, PT, OT, prophylactic antibiotics, VTE prophylaxis, progressive ambulation and ADL's and discharge planning. The patient is planning to be discharged home with home health services

## 2013-10-10 NOTE — Plan of Care (Signed)
Problem: Consults Goal: Diagnosis- Total Joint Replacement Primary Total Knee     

## 2013-10-10 NOTE — Op Note (Signed)
[name], [name]               ACCOUNT NO.:  [id number]  MEDICAL RECORD NO.:  [id number]  LOCATION:  60                         FACILITY:  [facility]  PHYSICIAN:  Lind Guest. Ninfa Linden, M.D.DATE OF BIRTH:  [birth year]  DATE OF PROCEDURE:  10/09/2013 DATE OF DISCHARGE:                              OPERATIVE REPORT   PREOPERATIVE DIAGNOSES:  Severe end-stage arthritis and degenerative joint disease, right knee.  POSTOPERATIVE DIAGNOSIS:  Severe end-stage arthritis and degenerative joint disease, right knee.  PROCEDURE:  Right total knee replacement.  IMPLANTS:  Stryker triathlon knee with size 4 femur, size 4 tibial tray, 9 mm polyethylene fix bearing insert, size 32 patellar button.  SURGEON:  Lind Guest. Ninfa Linden, M.D.  ASSISTANT:  Erskine Emery, PA-C  ANESTHESIA:  Spinal.  ANTIBIOTICS:  2 g IV Ancef.  TOURNIQUET TIME:  1 hour.  BLOOD LOSS:  Less than 100 mL.  COMPLICATIONS:  None.  INDICATIONS:  Mr. Stetzer is a frail 80 year old gentleman with chronic kidney disease who has debilitating arthritis involving his right knee. He has had a previous left total knee arthroplasty and bilateral hip replacements.  His right knee pain has become so debilitating for him. He eventually got cleared for surgery by his medical doctor and renal doctors and in light of his chronic renal insufficiency, his quality of life has been infected detrimentally by his knee.  He is at the point where he wished to proceed with a total knee arthroplasty.  He understands the impact it could have on his kidneys due to acute blood loss anemia.  He also understands the risk of nerve and vessel injury, fracture, infection, and deep vein thrombosis.  He understands the goals of the surgery decreased pain, improved mobility, and overall improved quality of life.  He has weighed these risks and benefits and does wish to proceed with surgery.  DESCRIPTION OF PROCEDURE:  After informed consent was  obtained, appropriate right knee was marked.  He was brought to the operating room and spinal anesthesia was obtained while he was on the bed.  He was then laid in a supine position.  A Foley catheter was placed and then his right operative leg had a nonsterile tourniquet placed around it.  It was then prepped and draped with DuraPrep and sterile drapes.  Time-out was called to identify correct patient, correct right knee.  We then used an Esmarch to wrap out the leg and tourniquet was inflated to 300 mmHg of pressure.  I made a midline incision directly over the patella and carried this proximally, distally, and dissected down the knee joint.  I performed a medial parapatellar arthrotomy and found a large joint effusion and significant loss of cartilage throughout the knee. We cleaned the knee of osteophytes, remnants of the medial lateral meniscus and ACL and PCL.  With the knee in a flexed position, we used the extramedullary cutting guide, and set our cutting guide for taking 9 mm off the high side of the tibia.  We also corrected for varus and valgus, and a negative slope.  We then made our tibial cut.  We then went to the femur and with an intramedullary guide to the intercondylar area,  we were able to set a distal femoral cut at 5 degrees externally rotated and 8 mm cut distally.  We made this cut without difficulty and brought the leg back down to full extension with a 9 mm extension block. I was pleased with his extension gap.  We then removed all pins and went to the femur.  We placed the femoral sizing guide based off the epicondylar axis and Whitesides line.  Through this, we chose a size 4 femur.  We then put the 4 in 1 cutting block and made our anterior and posterior cuts, followed by our chamfer cuts.  We then made our femoral box cut and drilled lugs for off this.  I then went back to the tibia. I then chose size 4 tibial trial and set the rotation off the tibial tubercle  and barely just medial to this.  I liked the fit of the size 4 tray, so we made our keel punch over this.  With the trial size 4 tray and a trial size 4 femur, we trialed a 9 mm polyethylene insert.  I was pleased with the flexion gap as well, and range of motion.  We then made our patellar cut and placed a 32 patellar button.  With all trial components in place, again I was pleased with the fit and range of motion.  We then removed all trial components and copiously irrigated the knee with normal saline solution using pulsatile lavage.  We then cemented the real Stryker Triathlon tibial tray size 4 followed by the real Stryker triathlon femoral component size 4, we placed a 9 mm polyethylene fix bearing insert and cemented the size 32 patellar button.  Once the cement had hardened, we let the tourniquet down. Hemostasis was obtained with electrocautery.  We then irrigated the knee with normal saline solution using pulsatile lavage.  We closed the arthrotomy with interrupted #1 Vicryl suture followed by 0 Vicryl in the deep tissue, 2-0 Vicryl in subcutaneous tissue, and staples on the skin. Xeroform well-padded and sterile dressing was applied.  He was taken to recovery room in stable condition.  All final counts were correct. There were no complications noted.  Of note, Erskine Emery, P.A., assisted in the entire case and his assistance was crucial in getting this case facilitated and completed, then later closure of the wound.     Lind Guest. Ninfa Linden, M.D.     CYB/MEDQ  D:  10/10/2013  T:  10/10/2013  Job:  [id number]

## 2013-10-10 NOTE — Progress Notes (Signed)
ANTICOAGULATION CONSULT NOTE - Initial Consult  Pharmacy Consult for Warfarin Indication: VTE prophylaxis  Allergies  Allergen Reactions  . Aspirin Other (See Comments)    bleeding    Patient Measurements:     Vital Signs: Temp: 97.5 F (36.4 C) (06/11 1110) Temp src: Oral (06/11 0538) BP: 177/92 mmHg (06/11 1110) Pulse Rate: 74 (06/11 1110)  Labs: No results found for this basename: HGB, HCT, PLT, APTT, LABPROT, INR, HEPARINUNFRC, CREATININE, CKTOTAL, CKMB, TROPONINI,  in the last 72 hours  The CrCl is unknown because both a height and weight (above a minimum accepted value) are required for this calculation.   Medical History: Past Medical History  Diagnosis Date  . Hypertension   . Cancer 06-19-12    Hx. 15 yrs ago -seed implant-PSA remains elevated somewhat.  . Arthritis     Osteoarthritis(right shoulder)-knees, hip . s/p LTKA  . Transfusion history 06-19-12    history s/p '11- LTKA  . Gout   . Dysrhythmia 06-19-12    medical record note shows hx R)BBB-. A.Fib/ flutter-not present  . Frequency of urination   . Anemia   . Swelling     10-03-13 both legs swells, right is greater     Assessment: 73 yoM s/p right TKA 6/11.  Pharmacy consulted to dose warfarin for VTE prophylaxis.  Of note, patient has a history of atrial flutter.  Patient does not take any oral anticoagulants PTA.  Baseline INR.  Warfarin score = 4 (for gender/weight), however, due to advanced age, will limit starting dose to 5 mg.  Goal of Therapy:  INR 2-3 Monitor platelets by anticoagulation protocol: Yes   Plan:  1.  Warfarin 5 mg po once today.  2.  Daily INR. 3.  Warfarin education book and video.  Pharmacist education prior to discharge.  Hershal Coria 10/10/2013,12:06 PM

## 2013-10-10 NOTE — Progress Notes (Signed)
Utilization review completed.  

## 2013-10-10 NOTE — Anesthesia Procedure Notes (Signed)
Spinal  Patient location during procedure: OR Start time: 10/10/2013 7:40 AM Staffing Anesthesiologist: Rod Mae L CRNA/Resident: [place] Shaniah Baltes C Performed by: resident/CRNA  Preanesthetic Checklist Completed: patient identified, site marked, surgical consent, pre-op evaluation, timeout performed, IV checked, risks and benefits discussed and monitors and equipment checked Spinal Block Patient position: sitting Prep: Betadine and site prepped and draped Patient monitoring: heart rate, cardiac monitor, continuous pulse ox and blood pressure Approach: left paramedian Location: L3-4 Injection technique: single-shot Needle Needle gauge: 22 G Assessment Sensory level: T6

## 2013-10-10 NOTE — Anesthesia Postprocedure Evaluation (Signed)
  Anesthesia Post-op Note  Patient: [name]  Procedure(s) Performed: Procedure(s) (LRB): RIGHT TOTAL KNEE ARTHROPLASTY (Right)  Patient Location: PACU  Anesthesia Type: Spinal  Level of Consciousness: awake and alert   Airway and Oxygen Therapy: Patient Spontanous Breathing  Post-op Pain: mild  Post-op Assessment: Post-op Vital signs reviewed, Patient's Cardiovascular Status Stable, Respiratory Function Stable, Patent Airway and No signs of Nausea or vomiting  Last Vitals:  Filed Vitals:   10/10/13 1030  BP: 155/78  Pulse: 73  Temp: 36.3 C  Resp: 18    Post-op Vital Signs: stable   Complications: No apparent anesthesia complications

## 2013-10-10 NOTE — Brief Op Note (Signed)
10/10/2013  9:19 AM  PATIENT:  [name]  80 y.o. male  PRE-OPERATIVE DIAGNOSIS:  Right knee osteoarthritis  POST-OPERATIVE DIAGNOSIS:  RIGHT KNEE OSTEOARTHRITIS  PROCEDURE:  Procedure(s): RIGHT TOTAL KNEE ARTHROPLASTY (Right)  SURGEON:  Surgeon(s) and Role:    * Mcarthur Rossetti, MD - Primary  PHYSICIAN ASSISTANT: Benita Stabile, PA-C  ANESTHESIA:   spinal  EBL:  Total I/O In: 1000 [I.V.:1000] Out: 50 [Urine:50]  BLOOD ADMINISTERED:none  DRAINS: none   LOCAL MEDICATIONS USED:  NONE  SPECIMEN:  No Specimen  DISPOSITION OF SPECIMEN:  N/A  COUNTS:  YES  TOURNIQUET:   Total Tourniquet Time Documented: Thigh (Right) - 59 minutes Total: Thigh (Right) - 59 minutes   DICTATION: .Other Dictation: Dictation Number [id number]  PLAN OF CARE: Admit to inpatient   PATIENT DISPOSITION:  PACU - hemodynamically stable.   Delay start of Pharmacological VTE agent (>24hrs) due to surgical blood loss or risk of bleeding: no

## 2013-10-10 NOTE — Progress Notes (Signed)
Clinical Social Work Department BRIEF PSYCHOSOCIAL ASSESSMENT 10/10/2013  Patient:  [name]     Account Number:  [id number]     Admit date:  10/10/2013  Clinical Social Worker:  Lacie Scotts  Date/Time:  10/10/2013 03:08 PM  Referred by:  Physician  Date Referred:  10/10/2013 Referred for  SNF Placement   Other Referral:   Interview type:  Patient Other interview type:    PSYCHOSOCIAL DATA Living Status:  ALONE Admitted from facility:   Level of care:   Primary support name:  Althea Charon Primary support relationship to patient:  CHILD, ADULT Degree of support available:   unclear    CURRENT CONCERNS Current Concerns  Post-Acute Placement   Other Concerns:    SOCIAL WORK ASSESSMENT / PLAN Pt is an 80 yr old gentleman living at home prior to hospitalization. CSW met with pt to assist with d/c planning. This is a planned admission. Pt has made prior arrangements to have ST Rehab at [facility] following hospital d/c. CSW has contacted SNF and d/c plans have been confirmed. CSW will continue to follow to assist with d/c planning to SNF.   Assessment/plan status:  Psychosocial Support/Ongoing Assessment of Needs Other assessment/ plan:   Information/referral to community resources:   Insurance coverage for SNF and Ambulance transport reviewed.    PATIENT'S/FAMILY'S RESPONSE TO PLAN OF CARE: Pt is still sleepy from meds. He is happy surgery is over. Pt had planned to have this surgery back in April but needed to postpone due to medical issues. He is looking forward to having rehab at [facility].    Werner Lean LCSW [phone]

## 2013-10-10 NOTE — Progress Notes (Signed)
Clinical Social Work Department CLINICAL SOCIAL WORK PLACEMENT NOTE 10/10/2013  Patient:  Delagarza,Worth  Account Number:  [id number] Admit date:  10/10/2013  Clinical Social Worker:  Werner Lean, LCSW  Date/time:  10/10/2013 03:18 PM  Clinical Social Work is seeking post-discharge placement for this patient at the following level of care:   SKILLED NURSING   (*CSW will update this form in Epic as items are completed)     Patient/family provided with [place] Department of Clinical Social Work's list of facilities offering this level of care within the geographic area requested by the patient (or if unable, by the patient's family).  10/10/2013  Patient/family informed of their freedom to choose among providers that offer the needed level of care, that participate in Medicare, Medicaid or managed care program needed by the patient, have an available bed and are willing to accept the patient.    Patient/family informed of [facility]' ownership interest in [facility], as well as of the fact that they are under no obligation to receive care at this facility.  PASARR submitted to EDS on 10/10/2013 PASARR number received on 10/10/2013  FL2 transmitted to all facilities in geographic area requested by pt/family on  10/10/2013 FL2 transmitted to all facilities within larger geographic area on   Patient informed that his/her managed care company has contracts with or will negotiate with  certain facilities, including the following:     Patient/family informed of bed offers received:  10/10/2013 Patient chooses bed at [facility] Physician recommends and patient chooses bed at    Patient to be transferred to [facility] on   Patient to be transferred to facility by  Patient and family notified of transfer on  Name of family member notified:    The following physician request were entered in Epic:   Additional Comments:  Werner Lean LCSW [phone]

## 2013-10-11 LAB — BASIC METABOLIC PANEL
BUN: 61 mg/dL — AB (ref 6–23)
CO2: 27 mEq/L (ref 19–32)
Calcium: 9.6 mg/dL (ref 8.4–10.5)
Chloride: 101 mEq/L (ref 96–112)
Creatinine, Ser: 2.19 mg/dL — ABNORMAL HIGH (ref 0.50–1.35)
GFR calc Af Amer: 29 mL/min — ABNORMAL LOW (ref >90)
GFR, EST NON AFRICAN AMERICAN: 25 mL/min — AB (ref >90)
GLUCOSE: 134 mg/dL — AB (ref 70–99)
Potassium: 4.6 mEq/L (ref 3.7–5.3)
Sodium: 139 mEq/L (ref 137–147)

## 2013-10-11 LAB — PROTIME-INR
INR: 1.14 (ref 0.00–1.49)
PROTHROMBIN TIME: 14.4 s (ref 11.6–15.2)

## 2013-10-11 LAB — CBC
HCT: 22.2 % — ABNORMAL LOW (ref 39.0–52.0)
HEMOGLOBIN: 7.3 g/dL — AB (ref 13.0–17.0)
MCH: 29.4 pg (ref 26.0–34.0)
MCHC: 32.9 g/dL (ref 30.0–36.0)
MCV: 89.5 fL (ref 78.0–100.0)
Platelets: 211 10*3/uL (ref 150–400)
RBC: 2.48 MIL/uL — ABNORMAL LOW (ref 4.22–5.81)
RDW: 14.6 % (ref 11.5–15.5)
WBC: 7.8 10*3/uL (ref 4.0–10.5)

## 2013-10-11 LAB — PREPARE RBC (CROSSMATCH)

## 2013-10-11 MED ORDER — WARFARIN SODIUM 5 MG PO TABS
5.0000 mg | ORAL_TABLET | Freq: Once | ORAL | Status: AC
  Administered 2013-10-11: 5 mg via ORAL
  Filled 2013-10-11: qty 1

## 2013-10-11 MED ORDER — FUROSEMIDE 10 MG/ML IJ SOLN
20.0000 mg | Freq: Once | INTRAMUSCULAR | Status: AC
  Administered 2013-10-11: 20 mg via INTRAVENOUS
  Filled 2013-10-11: qty 2

## 2013-10-11 NOTE — Progress Notes (Signed)
[name] for Warfarin Indication: VTE prophylaxis  Allergies  Allergen Reactions  . Aspirin Other (See Comments)    bleeding    Patient Measurements: Height: 5\' 5"  (165.1 cm) Weight: 196 lb (88.905 kg) IBW/kg (Calculated) : 61.5   Vital Signs: Temp: 99.4 F (37.4 C) (06/12 0522) Temp src: Oral (06/12 0522) BP: 163/86 mmHg (06/12 0522) Pulse Rate: 92 (06/12 0522)  Labs:  Recent Labs  10/11/13 0407  HGB 7.3*  HCT 22.2*  PLT 211  LABPROT 14.4  INR 1.14  CREATININE 2.19*    Estimated Creatinine Clearance: 24.4 ml/min (by C-G formula based on Cr of 2.19).   Medical History: Past Medical History  Diagnosis Date  . Hypertension   . Cancer 06-19-12    Hx. 15 yrs ago -seed implant-PSA remains elevated somewhat.  . Arthritis     Osteoarthritis(right shoulder)-knees, hip . s/p LTKA  . Transfusion history 06-19-12    history s/p '11- LTKA  . Gout   . Dysrhythmia 06-19-12    medical record note shows hx R)BBB-. A.Fib/ flutter-not present  . Frequency of urination   . Anemia   . Swelling     10-03-13 both legs swells, right is greater   Inpatient warfarin doses administered: 6/11:  5 mg  Assessment: 46 yoM s/p right TKA 6/11.  Pharmacy consulted to dose warfarin for VTE prophylaxis.  Of note, patient has a history of atrial flutter.  Patient does not take any oral anticoagulants PTA.  Baseline INR WNL.    6/12: Warfarin initiation in progress Small INR response as expected Hgb low - ABLA.   No overt bleeding reported. Warfarin teaching session completed today.  Goal of Therapy:  INR 2-3    Plan:  1.  Warfarin 5 mg po x 1 today at 1800.  2.  Daily PT / INR while inpatient.  Clayburn Pert, PharmD, BCPS Pager: [phone] 10/11/2013  8:11 AM

## 2013-10-11 NOTE — Progress Notes (Signed)
Physical Therapy Treatment Patient Details Name: [name] MRN: [id number] DOB: [birth year] Today's Date: 10/11/2013    History of Present Illness R TKA    PT Comments    Pt continues pleasant and cooperative but easily distractible.  Follow Up Recommendations  SNF     Equipment Recommendations  None recommended by PT    Recommendations for Other Services OT consult     Precautions / Restrictions Precautions Precautions: Fall;Knee Restrictions Weight Bearing Restrictions: No    Mobility  Bed Mobility Overal bed mobility: Needs Assistance;+2 for physical assistance Bed Mobility: Sit to Supine     Supine to sit: +2 for physical assistance;Mod assist Sit to supine: +2 for physical assistance;Mod assist   General bed mobility comments: cues for sequence with physical assist to bring Bil LEs onto bed and to control trunk   Transfers Overall transfer level: Needs assistance Equipment used: Rolling walker (2 wheeled) Transfers: Sit to/from Stand Sit to Stand: +2 physical assistance;Max assist         General transfer comment: cues for hand placement, assist to rise  Ambulation/Gait Ambulation/Gait assistance: +2 physical assistance;+2 safety/equipment;Mod assist Ambulation Distance (Feet): 12 Feet Assistive device: Rolling walker (2 wheeled) Gait Pattern/deviations: Step-to pattern;Decreased step length - right;Decreased step length - left;Shuffle;Trunk flexed Gait velocity: decr Gait velocity interpretation: Below normal speed for age/gender General Gait Details: cues for sequence, posture, position from RW, stride length, UE WB; physical assist for balance, support, RW management and to advance R LE; multiple short rests required to complete task   Stairs            Wheelchair Mobility    Modified Rankin (Stroke Patients Only)       Balance Overall balance assessment: Needs assistance   Sitting balance-Leahy Scale: Fair       Standing  balance-Leahy Scale: Poor                      Cognition Arousal/Alertness: Awake/alert Behavior During Therapy: WFL for tasks assessed/performed Overall Cognitive Status: Impaired/Different from baseline Area of Impairment: Memory;Problem solving;Following commands     Memory: Decreased short-term memory;Decreased recall of precautions Following Commands: Follows one step commands inconsistently     Problem Solving: Slow processing      Exercises Total Joint Exercises Ankle Circles/Pumps: AROM;Both;10 reps;Supine Heel Slides: AAROM;Right;10 reps;Supine Hip ABduction/ADduction: AAROM;Right;10 reps;Supine Goniometric ROM: 40* flexion R knee AAROM    General Comments        Pertinent Vitals/Pain 4/10    Home Living Family/patient expects to be discharged to:: Private residence Living Arrangements: Alone Available Help at Discharge: Skilled Nursing Facility Type of Home: House Home Access: Stairs to enter     Home Equipment: [place] - single point;Walker - 2 wheels;Shower seat;Bedside commode      Prior Function Level of Independence: Independent          PT Goals (current goals can now be found in the care plan section) Acute Rehab PT Goals Patient Stated Goal: to be able to do my self care PT Goal Formulation: With patient/family Time For Goal Achievement: 10/25/13 Potential to Achieve Goals: Good Progress towards PT goals: Progressing toward goals    Frequency  7X/week    PT Plan Current plan remains appropriate    Co-evaluation             End of Session Equipment Utilized During Treatment: Gait belt Activity Tolerance: Patient tolerated treatment well;Patient limited by fatigue Patient left: in bed;with call bell/phone  within reach     Time: 1551-1614 PT Time Calculation (min): 23 min  Charges:  $Gait Training: 23-37 mins $Therapeutic Exercise: 8-22 mins                    G Codes:      Valincia Touch October 29, 2013, 5:07 PM

## 2013-10-11 NOTE — Progress Notes (Signed)
Subjective: 1 Day Post-Op Procedure(s) (LRB): RIGHT TOTAL KNEE ARTHROPLASTY (Right) Patient reports pain as mild.  No complaints.  Objective: Vital signs in last 24 hours: Temp:  [94.6 F (34.8 C)-100 F (37.8 C)] 99.4 F (37.4 C) (06/12 0522) Pulse Rate:  [70-96] 92 (06/12 0522) Resp:  [16-25] 18 (06/12 0800) BP: (109-179)/(50-101) 163/86 mmHg (06/12 0522) SpO2:  [96 %-100 %] 98 % (06/12 0800) Weight:  [88.905 kg (196 lb)] 88.905 kg (196 lb) (06/11 1130)  Intake/Output from previous day: 06/11 0701 - 06/12 0700 In: 4031.7 [P.O.:180; I.V.:3751.7; IV Piggyback:100] Out: 1445 [Urine:1445] Intake/Output this shift:     Recent Labs  10/11/13 0407  HGB 7.3*    Recent Labs  10/11/13 0407  WBC 7.8  RBC 2.48*  HCT 22.2*  PLT 211    Recent Labs  10/11/13 0407  NA 139  K 4.6  CL 101  CO2 27  BUN 61*  CREATININE 2.19*  GLUCOSE 134*  CALCIUM 9.6    Recent Labs  10/11/13 0407  INR 1.14    Sensation intact distally Intact pulses distally Dorsiflexion/Plantar flexion intact Incision: dressing C/D/I Compartment soft  Assessment/Plan: 1 Day Post-Op Procedure(s) (LRB): RIGHT TOTAL KNEE ARTHROPLASTY (Right) Up with therapy Will leave foley in until tomorrow Monitor BUN/Creatine PRBC's due post anemia secondary to surgery  Erskine Emery 10/11/2013, 8:34 AM

## 2013-10-11 NOTE — Clinical Documentation Improvement (Signed)
Patient with history of chronic kidney disease:   Black male  Cr = 2.19 on admission  GFR = 29  Please clarify the stage of CKD using the chart below and document findings in next progress note and discharge summary.  _______CKD Stage I - GFR > OR = 90 _______CKD Stage II - GFR 60-80 _______CKD Stage III - GFR 30-59 _______CKD Stage IV - GFR 15-29 _______CKD Stage V - GFR < 15 _______ESRD (End Stage Renal Disease) _______Other condition_____________    Angela Adam ,RN Clinical Documentation Specialist:  [phone]  [facility] Information Management

## 2013-10-11 NOTE — Progress Notes (Signed)
OT Cancellation Note  Patient Details Name: [name] MRN: [id number] DOB: Sep 14, 1923   Cancelled Treatment:    Reason Eval/Treat Not Completed: Other (comment)  Pt is Medicare/Medicaid and current D/C plan is SNF. No apparent immediate acute care OT needs, therefore will defer OT to SNF. If OT eval is needed please call Acute Rehab Dept. at [facility] 10/11/2013, 2:28 PM Lesle Chris, OTR/L [phone] 10/11/2013

## 2013-10-11 NOTE — Evaluation (Signed)
Physical Therapy Evaluation Patient Details Name: [name] MRN: [id number] DOB: 1924-02-13 Today's Date: 10/11/2013   History of Present Illness  R TKA  Clinical Impression  *Pt is s/p TKA resulting in the deficits listed below (see PT Problem List). ** Pt will benefit from skilled PT to increase their independence and safety with mobility to allow discharge to the venue listed below.   +2 assist for mobility. Pt walked 6' with RW with significant assist to advance RLE. Pt has difficulty following directions, may be due to pain medicine. SNF recommended.  **    Follow Up Recommendations SNF    Equipment Recommendations  None recommended by PT    Recommendations for Other Services OT consult     Precautions / Restrictions Precautions Precautions: Fall;Knee Restrictions Weight Bearing Restrictions: No      Mobility  Bed Mobility Overal bed mobility: Needs Assistance;+2 for physical assistance Bed Mobility: Supine to Sit     Supine to sit: +2 for physical assistance;Mod assist     General bed mobility comments: assist to raise trunk and to support RLE, multimodal cues for technique  Transfers Overall transfer level: Needs assistance Equipment used: Rolling walker (2 wheeled) Transfers: Sit to/from Stand Sit to Stand: +2 physical assistance;Max assist         General transfer comment: cues for hand placement, assist to rise  Ambulation/Gait Ambulation/Gait assistance: +2 physical assistance;Mod assist Ambulation Distance (Feet): 6 Feet Assistive device: Rolling walker (2 wheeled) Gait Pattern/deviations: Step-to pattern;Decreased step length - left;Decreased stance time - right;Decreased weight shift to right   Gait velocity interpretation: Below normal speed for age/gender General Gait Details: assist and multimodal cues to advance RLE, multimodal cues for sequencing and managing RW, generally has difficulty following directions -family stated pt is "loopy  from the medicine"  Stairs            Wheelchair Mobility    Modified Rankin (Stroke Patients Only)       Balance Overall balance assessment: Needs assistance   Sitting balance-Leahy Scale: Fair       Standing balance-Leahy Scale: Poor                               Pertinent Vitals/Pain *Pt didn't rate pain when asked, but stated, "its ok".  Premedicated, ice applied to R knee**    Home Living Family/patient expects to be discharged to:: Private residence Living Arrangements: Alone Available Help at Discharge: [place] Type of Home: House Home Access: Stairs to enter   [employer] of Steps: 2   [place] - single point;Walker - 2 wheels;Shower seat;Bedside commode      Prior Function Level of Independence: Independent               Hand Dominance        Extremity/Trunk Assessment   Upper Extremity Assessment: RUE deficits/detail RUE Deficits / Details: shoulder flexion AROM to 75*, pt stated he has arthritis in R shoulder         Lower Extremity Assessment: RLE deficits/detail RLE Deficits / Details: knee flexion AAROM 40*, ankle WNL, SLR -2/5    Cervical / Trunk Assessment: Normal  Communication   Communication: HOH  Cognition Arousal/Alertness: Awake/alert Behavior During Therapy: WFL for tasks assessed/performed Overall Cognitive Status: Impaired/Different from baseline (family stated pt is "loopy from the medicine") Area of Impairment: Memory;Problem solving;Following commands       Following Commands:  Follows one step commands inconsistently            General Comments      Exercises Total Joint Exercises Ankle Circles/Pumps: AROM;Both;10 reps;Supine Heel Slides: AAROM;Right;10 reps;Supine Hip ABduction/ADduction: AAROM;Right;10 reps;Supine Goniometric ROM: 40* flexion R knee AAROM      Assessment/Plan    PT Assessment Patient needs continued PT services  PT  Diagnosis Difficulty walking;Acute pain   PT Problem List Decreased strength;Decreased range of motion;Decreased activity tolerance;Decreased balance;Decreased cognition;Decreased mobility;Pain;Decreased knowledge of use of DME  PT Treatment Interventions DME instruction;Gait training;Stair training;Functional mobility training;Therapeutic activities;Patient/family education;Cognitive remediation;Balance training;Therapeutic exercise   PT Goals (Current goals can be found in the Care Plan section) Acute Rehab PT Goals Patient Stated Goal: to be able to do my self care PT Goal Formulation: With patient/family Time For Goal Achievement: 10/25/13 Potential to Achieve Goals: Good    Frequency Min 5X/week   Barriers to discharge        Co-evaluation               End of Session Equipment Utilized During Treatment: Gait belt Activity Tolerance: Patient limited by fatigue Patient left: in chair;with call bell/phone within reach;with family/visitor present           Time: 1330-1402 PT Time Calculation (min): 32 min   Charges:   PT Evaluation $Initial PT Evaluation Tier I: 1 Procedure PT Treatments $Gait Training: 8-22 mins $Therapeutic Exercise: 8-22 mins   PT G Codes:          [name] 10/11/2013, 2:13 PM [phone]

## 2013-10-12 ENCOUNTER — Inpatient Hospital Stay (HOSPITAL_COMMUNITY): Payer: Medicare Other

## 2013-10-12 DIAGNOSIS — N189 Chronic kidney disease, unspecified: Secondary | ICD-10-CM

## 2013-10-12 DIAGNOSIS — IMO0002 Reserved for concepts with insufficient information to code with codable children: Secondary | ICD-10-CM

## 2013-10-12 DIAGNOSIS — M109 Gout, unspecified: Secondary | ICD-10-CM

## 2013-10-12 DIAGNOSIS — Z96659 Presence of unspecified artificial knee joint: Secondary | ICD-10-CM

## 2013-10-12 DIAGNOSIS — M1710 Unilateral primary osteoarthritis, unspecified knee: Secondary | ICD-10-CM

## 2013-10-12 LAB — CBC
HEMATOCRIT: 26.2 % — AB (ref 39.0–52.0)
HEMOGLOBIN: 8.8 g/dL — AB (ref 13.0–17.0)
MCH: 29.3 pg (ref 26.0–34.0)
MCHC: 33.6 g/dL (ref 30.0–36.0)
MCV: 87.3 fL (ref 78.0–100.0)
Platelets: 200 10*3/uL (ref 150–400)
RBC: 3 MIL/uL — ABNORMAL LOW (ref 4.22–5.81)
RDW: 15.6 % — ABNORMAL HIGH (ref 11.5–15.5)
WBC: 10.7 10*3/uL — ABNORMAL HIGH (ref 4.0–10.5)

## 2013-10-12 LAB — BASIC METABOLIC PANEL
BUN: 67 mg/dL — AB (ref 6–23)
CALCIUM: 9.5 mg/dL (ref 8.4–10.5)
CO2: 25 mEq/L (ref 19–32)
Chloride: 98 mEq/L (ref 96–112)
Creatinine, Ser: 2.62 mg/dL — ABNORMAL HIGH (ref 0.50–1.35)
GFR calc Af Amer: 24 mL/min — ABNORMAL LOW (ref >90)
GFR, EST NON AFRICAN AMERICAN: 20 mL/min — AB (ref >90)
GLUCOSE: 126 mg/dL — AB (ref 70–99)
Potassium: 4.5 mEq/L (ref 3.7–5.3)
Sodium: 137 mEq/L (ref 137–147)

## 2013-10-12 LAB — PROTIME-INR
INR: 1.23 (ref 0.00–1.49)
PROTHROMBIN TIME: 15.2 s (ref 11.6–15.2)

## 2013-10-12 MED ORDER — WARFARIN SODIUM 5 MG PO TABS
5.0000 mg | ORAL_TABLET | Freq: Once | ORAL | Status: AC
  Administered 2013-10-12: 5 mg via ORAL
  Filled 2013-10-12: qty 1

## 2013-10-12 NOTE — Consult Note (Signed)
[facility] Medical Consultation  [name] ZOX:[id number] DOB: 1923-11-05 DOA: 10/10/2013 PCP: [name], MD   Requesting physician: Dr. Erlinda Hong (orthopedic surgery) Date of consultation: 10/12/2013 Reason for consultation: renal insufficiency  Impression/Recommendations Principal Problem:   Arthritis of knee, right Active Problems:   Status post total knee replacement   HPI:  80 y.o. male, has a history of pain and functional disability in the right knee due to arthritis who presented to [facility] ED 10/10/2013. He underwent right kne arthoplasty. His hospital course somewhat complicated due to worsening renal function. TR[facility] consulted for renal insufficiency. Pt has no current complaints.   Assessment and Plan:  Principal Problem: Right knee pain status post right knee arthroplasty - management per ortho team  Active Problems: Chronic kidney disease, stage IV - patient baseline creatinine 2.3 since 07/2013 and current creatinine trending up to 2.6; he is on Hctz as well as lasix which may contribute to renal insufficiency - renal US showed chronic renal medical disease but no acute findings such as obstruction, hydronephrosis - i stopped Hctz since pt is already on lasix no need to have 2 types of diuretics. He dose have some LE edema and he could potentially be on a lower dose of lasix or stop it altogether until renal function improves. For now, only Hctz stopped - he is on low rate IV fluids. - follow up BMP in am - will see the trend in am and if trend up will get renal input  Will follow with you in am  Leisa Lenz [facility] [phone] or [phone]   Review of Systems:  Constitutional: Negative for fever, chills, diaphoresis, activity change, appetite change and fatigue.  HENT: Negative for ear pain, nosebleeds, congestion, facial swelling, rhinorrhea, neck pain, neck stiffness and ear discharge.   Eyes: Negative for pain, discharge, redness, itching and visual disturbance.   Respiratory: Negative for cough, choking, chest tightness, shortness of breath, wheezing and stridor.   Cardiovascular: Negative for chest pain, palpitations and leg swelling.  Gastrointestinal: Negative for abdominal distention.  Genitourinary: Negative for dysuria, urgency, frequency, hematuria, flank pain, decreased urine volume, difficulty urinating and dyspareunia.  Musculoskeletal: Negative for back pain, joint swelling, arthralgias and gait problem.  Neurological: Negative for dizziness, tremors, seizures, syncope, facial asymmetry, speech difficulty, weakness, light-headedness, numbness and headaches.  Hematological: Negative for adenopathy. Does not bruise/bleed easily.  Psychiatric/Behavioral: Negative for hallucinations, behavioral problems, confusion, dysphoric mood, decreased concentration and agitation.     Past Medical History  Diagnosis Date  . Hypertension   . Cancer 06-19-12    Hx. 15 yrs ago -seed implant-PSA remains elevated somewhat.  . Arthritis     Osteoarthritis(right shoulder)-knees, hip . s/p LTKA  . Transfusion history 06-19-12    history s/p '11- LTKA  . Gout   . Dysrhythmia 06-19-12    medical record note shows hx R)BBB-. A.Fib/ flutter-not present  . Frequency of urination   . Anemia   . Swelling     10-03-13 both legs swells, right is greater   Past Surgical History  Procedure Laterality Date  . Joint replacement  06-19-12    '11 -LTKA  . Cataract extraction, bilateral    . Minor hemorrhoidectomy    . Radioactive seed implant  06-19-12    many yrs ago > 15 yrs  . Total hip arthroplasty Left 06/29/2012    Procedure: LEFT TOTAL HIP ARTHROPLASTY ANTERIOR APPROACH;  Surgeon: Mcarthur Rossetti, MD;  Location: [facility] ORS;  Service: Orthopedics;  Laterality: Left;  Left  Total Hip Arthroplasty, Anterior Approach  . Total knee arthroplasty Right 10/10/2013    Procedure: RIGHT TOTAL KNEE ARTHROPLASTY;  Surgeon: Mcarthur Rossetti, MD;  Location: [facility] ORS;   Service: Orthopedics;  Laterality: Right;   Social History:  reports that he quit smoking about 33 years ago. His smoking use included Cigarettes. He smoked 0.00 packs per day. He does not have any smokeless tobacco history on file. He reports that he drinks about 1.8 ounces of alcohol per week. He reports that he does not use illicit drugs.  Allergies  Allergen Reactions  . Aspirin Other (See Comments)    bleeding   History reviewed. No pertinent family history.  Prior to Admission medications   Medication Sig Start Date End Date Taking? Authorizing Provider  acetaminophen (TYLENOL) 500 MG tablet Take 500 mg by mouth every 6 (six) hours as needed for moderate pain.   Yes Historical Provider, MD  furosemide (LASIX) 40 MG tablet Take 40 mg by mouth every morning.   Yes Historical Provider, MD  HYDROcodone-acetaminophen (NORCO/VICODIN) 5-325 MG per tablet Take 1 tablet by mouth every 6 (six) hours as needed for moderate pain.   Yes Historical Provider, MD  allopurinol (ZYLOPRIM) 100 MG tablet Take 100 mg by mouth every evening.     Historical Provider, MD  clobetasol cream (TEMOVATE) 6.64 % Apply 1 application topically 2 (two) times daily. Applying to both legs x 2 daily for swelling    Historical Provider, MD  ferrous sulfate 325 (65 FE) MG tablet Take 325 mg by mouth daily with breakfast.    Historical Provider, MD  Multiple Vitamin (MULTIVITAMIN WITH MINERALS) TABS Take 1 tablet by mouth every morning.    Historical Provider, MD  olmesartan-hydrochlorothiazide (BENICAR HCT) 40-12.5 MG per tablet Take 1 tablet by mouth every morning.    Historical Provider, MD  Omega-3 Fatty Acids (FISH OIL) 1200 MG CAPS Take 1,200 mg by mouth at bedtime. Includes 360mg  of Omega-3.    Historical Provider, MD  pravastatin (PRAVACHOL) 20 MG tablet Take 20 mg by mouth at bedtime.    Historical Provider, MD  Tamsulosin HCl (FLOMAX) 0.4 MG CAPS Take 0.4 mg by mouth at bedtime.    Historical Provider, MD    Physical Exam: Blood pressure 154/80, pulse 113, temperature 100.8 F (38.2 C), temperature source Oral, resp. rate 17, height 5\' 5"  (1.651 m), weight 88.905 kg (196 lb), SpO2 94.00%. Filed Vitals:   10/12/13 1429  BP: 154/80  Pulse: 113  Temp: 100.8 F (38.2 C)  Resp: 17    Physical Exam  Constitutional: Appears well-developed and well-nourished. No distress.  HENT: Normocephalic. External right and left ear normal.   Eyes: Conjunctivae and EOM are normal. PERRLA, no scleral icterus.  Neck: Normal ROM. Neck supple. No JVD. No tracheal deviation. No thyromegaly.  CVS: RRR, S1/S2 appreciated  Pulmonary: Effort and breath sounds normal, no stridor, rhonchi, wheezes, rales.  Abdominal: Soft. BS +,  no distension, tenderness, rebound or guarding.  Musculoskeletal: dressing over the knee after surgery (right); LE edema +1  Lymphadenopathy: No lymphadenopathy noted, cervical, inguinal. Neuro: Alert. Normal reflexes, muscle tone coordination. No cranial nerve deficit. Skin: Skin is warm and dry. No rash noted.   Psychiatric: Normal mood and affect. Behavior, judgment, thought content normal.    Labs on Admission:  Basic Metabolic Panel:  Recent Labs Lab 10/11/13 0407 10/12/13 0528  NA 139 137  K 4.6 4.5  CL 101 98  CO2 27 25  GLUCOSE 134* 126*  BUN 61* 67*  CREATININE 2.19* 2.62*  CALCIUM 9.6 9.5   Liver Function Tests: No results found for this basename: AST, ALT, ALKPHOS, BILITOT, PROT, ALBUMIN,  in the last 168 hours No results found for this basename: LIPASE, AMYLASE,  in the last 168 hours No results found for this basename: AMMONIA,  in the last 168 hours CBC:  Recent Labs Lab 10/11/13 0407 10/12/13 0528  WBC 7.8 10.7*  HGB 7.3* 8.8*  HCT 22.2* 26.2*  MCV 89.5 87.3  PLT 211 200   Cardiac Enzymes: No results found for this basename: CKTOTAL, CKMB, CKMBINDEX, TROPONINI,  in the last 168 hours BNP: No components found with this basename: POCBNP,   CBG: No results found for this basename: GLUCAP,  in the last 168 hours  Radiological Exams on Admission: No results found.  Time spent: 6 minutes  Leisa Lenz [facility] Pager [phone]  If 7PM-7AM, please contact night-coverage [email or web address] Password [facility] 10/12/2013, 3:04 PM

## 2013-10-12 NOTE — Progress Notes (Signed)
Physical Therapy Treatment Patient Details Name: Kendal Gaw MRN: [id number] DOB: 1923/06/20 Today's Date: 10/12/2013    History of Present Illness      PT Comments    Improvement in activity tolerance and ability to follow cues noted  Follow Up Recommendations  SNF     Equipment Recommendations  None recommended by PT    Recommendations for Other Services OT consult     Precautions / Restrictions Precautions Precautions: Fall;Knee Restrictions Weight Bearing Restrictions: No    Mobility  Bed Mobility Overal bed mobility: Needs Assistance;+2 for physical assistance Bed Mobility: Supine to Sit     Supine to sit: +2 for physical assistance;Mod assist     General bed mobility comments: cues for sequence with physical assist to manage LEs and to bring trunk to upright  Transfers Overall transfer level: Needs assistance Equipment used: Rolling walker (2 wheeled) Transfers: Sit to/from Stand Sit to Stand: +2 physical assistance;Max assist         General transfer comment: cues for hand placement, assist to rise  Ambulation/Gait Ambulation/Gait assistance: +2 physical assistance;Mod assist Ambulation Distance (Feet): 17 Feet Assistive device: Rolling walker (2 wheeled) Gait Pattern/deviations: Step-to pattern;Decreased step length - right;Decreased step length - left;Shuffle;Trunk flexed Gait velocity: decr   General Gait Details: cues for sequence, posture, position from RW, stride length, UE WB; physical assist for balance, support, RW management and initially to advance R LE;   Stairs            Wheelchair Mobility    Modified Rankin (Stroke Patients Only)       Balance                                    Cognition Arousal/Alertness: Awake/alert Behavior During Therapy: WFL for tasks assessed/performed Overall Cognitive Status: Impaired/Different from baseline Area of Impairment: Memory;Problem solving;Following commands     Memory: Decreased short-term memory;Decreased recall of precautions Following Commands: Follows one step commands inconsistently     Problem Solving: Slow processing      Exercises Total Joint Exercises Ankle Circles/Pumps: AROM;Both;Supine;15 reps Quad Sets: AROM;AAROM;10 reps;Supine;Both Heel Slides: AAROM;Right;Supine;15 reps Straight Leg Raises: AAROM;Right;Supine;15 reps Goniometric ROM: AAROM at R kne -10 - 45    General Comments        Pertinent Vitals/Pain 4/10; premed, cold packs provided    Home Living                      Prior Function            PT Goals (current goals can now be found in the care plan section) Acute Rehab PT Goals Patient Stated Goal: to be able to do my self care PT Goal Formulation: With patient/family Time For Goal Achievement: 10/25/13 Potential to Achieve Goals: Good Progress towards PT goals: Progressing toward goals    Frequency  7X/week    PT Plan Current plan remains appropriate    Co-evaluation             End of Session Equipment Utilized During Treatment: Gait belt Activity Tolerance: Patient tolerated treatment well Patient left: in chair;with call bell/phone within reach     Time: 1132-1203 PT Time Calculation (min): 31 min  Charges:  $Gait Training: 8-22 mins $Therapeutic Exercise: 8-22 mins                    G Codes:  Bradlee Bridgers 10/12/2013, 4:22 PM

## 2013-10-12 NOTE — Progress Notes (Signed)
Physical Therapy Treatment Patient Details Name: [name] MRN: [id number] DOB: 01-Feb-1924 Today's Date: 10/12/2013    History of Present Illness R TKR    PT Comments    Continued improvement in activity tolerance and ability to follow cues.  Pt very pleasant and motivated  Follow Up Recommendations  SNF     Equipment Recommendations  None recommended by PT    Recommendations for Other Services OT consult     Precautions / Restrictions Precautions Precautions: Fall;Knee Restrictions Weight Bearing Restrictions: No    Mobility  Bed Mobility Overal bed mobility: Needs Assistance;+2 for physical assistance Bed Mobility: Sit to Supine     Supine to sit: +2 for physical assistance;Mod assist Sit to supine: +2 for physical assistance;Max assist   General bed mobility comments: cues for sequence with physical assist to manage LEs and to control trunk descent  Transfers Overall transfer level: Needs assistance Equipment used: Rolling walker (2 wheeled) Transfers: Sit to/from Stand Sit to Stand: +2 physical assistance;Max assist         General transfer comment: cues for hand placement, assist to rise  Ambulation/Gait Ambulation/Gait assistance: Mod assist;+2 physical assistance Ambulation Distance (Feet): 17 Feet Assistive device: Rolling walker (2 wheeled) Gait Pattern/deviations: Step-to pattern;Decreased step length - right;Decreased step length - left;Shuffle;Trunk flexed Gait velocity: decr   General Gait Details: cues for sequence, posture, position from RW, stride length, UE WB; physical assist for balance, support, RW management and initially to advance R LE;   Stairs            Wheelchair Mobility    Modified Rankin (Stroke Patients Only)       Balance                                    Cognition Arousal/Alertness: Awake/alert Behavior During Therapy: WFL for tasks assessed/performed Overall Cognitive Status: Within  Functional Limits for tasks assessed Area of Impairment: Memory;Problem solving;Following commands     Memory: Decreased short-term memory;Decreased recall of precautions Following Commands: Follows one step commands inconsistently     Problem Solving: Slow processing      Exercises Total Joint Exercises Ankle Circles/Pumps: AROM;Both;Supine;15 reps Quad Sets: AROM;AAROM;10 reps;Supine;Both Heel Slides: AAROM;Right;Supine;15 reps Straight Leg Raises: AAROM;Right;Supine;15 reps Goniometric ROM: AAROM at R kne -10 - 45    General Comments        Pertinent Vitals/Pain 4/10; premed with Tylenol only, ice packs provided    Home Living                      Prior Function            PT Goals (current goals can now be found in the care plan section) Acute Rehab PT Goals Patient Stated Goal: to be able to do my self care PT Goal Formulation: With patient/family Time For Goal Achievement: 10/25/13 Potential to Achieve Goals: Good Progress towards PT goals: Progressing toward goals    Frequency  7X/week    PT Plan Current plan remains appropriate    Co-evaluation             End of Session Equipment Utilized During Treatment: Gait belt Activity Tolerance: Patient tolerated treatment well Patient left: in bed;with call bell/phone within reach;with family/visitor present     Time: 4098-1191 PT Time Calculation (min): 19 min  Charges:  $Gait Training: 8-22 mins $Therapeutic Exercise: 8-22 mins  G Codes:      [name] October 28, 2013, 4:25 PM

## 2013-10-12 NOTE — Progress Notes (Signed)
Subjective: 2 Days Post-Op Procedure(s) (LRB): RIGHT TOTAL KNEE ARTHROPLASTY (Right) Patient reports pain as mild.  No complaints.  Objective: Vital signs in last 24 hours: Temp:  [97.4 F (36.3 C)-99.4 F (37.4 C)] 98.9 F (37.2 C) (06/13 0530) Pulse Rate:  [90-103] 103 (06/13 0530) Resp:  [14-20] 16 (06/13 0530) BP: (124-157)/(67-94) 138/76 mmHg (06/13 0530) SpO2:  [92 %-100 %] 98 % (06/13 0530)  Intake/Output from previous day: 06/12 0701 - 06/13 0700 In: 2189 [P.O.:600; I.V.:951.7; Blood:637.3] Out: 950 [Urine:950] Intake/Output this shift:     Recent Labs  10/11/13 0407 10/12/13 0528  HGB 7.3* 8.8*    Recent Labs  10/11/13 0407 10/12/13 0528  WBC 7.8 10.7*  RBC 2.48* 3.00*  HCT 22.2* 26.2*  PLT 211 200    Recent Labs  10/11/13 0407 10/12/13 0528  NA 139 137  K 4.6 4.5  CL 101 98  CO2 27 25  BUN 61* 67*  CREATININE 2.19* 2.62*  GLUCOSE 134* 126*  CALCIUM 9.6 9.5    Recent Labs  10/11/13 0407 10/12/13 0528  INR 1.14 1.23    Incision c/d/i  Assessment/Plan: 2 Days Post-Op Procedure(s) (LRB): RIGHT TOTAL KNEE ARTHROPLASTY (Right) Up with therapy Adequate response with transfusion Continue foley, Cr rising - will call hospitalist for assistance Dressings changed  Marianna Payment 10/12/2013, 7:56 AM

## 2013-10-12 NOTE — Progress Notes (Signed)
[place] for Warfarin Indication: VTE prophylaxis  Allergies  Allergen Reactions  . Aspirin Other (See Comments)    bleeding    Patient Measurements: Height: 5\' 5"  (165.1 cm) Weight: 196 lb (88.905 kg) IBW/kg (Calculated) : 61.5   Vital Signs: Temp: 98.9 F (37.2 C) (06/13 0530) Temp src: Oral (06/13 0530) BP: 138/76 mmHg (06/13 0530) Pulse Rate: 103 (06/13 0530)  Labs:  Recent Labs  10/11/13 0407 10/12/13 0528  HGB 7.3* 8.8*  HCT 22.2* 26.2*  PLT 211 200  LABPROT 14.4 15.2  INR 1.14 1.23  CREATININE 2.19* 2.62*    Estimated Creatinine Clearance: 20.4 ml/min (by C-G formula based on Cr of 2.62).  Inpatient warfarin doses administered: 6/11-6/12:  5 mg, 5mg   Assessment: 53 yoM s/p right TKA 6/11.  Pharmacy consulted to dose warfarin for VTE prophylaxis.  Of note, patient has a history of atrial flutter.  Patient does not take any oral anticoagulants PTA.  Baseline INR WNL.    6/13: Warfarin initiation in progress INR subtherapeutic but responding Hgb improved  - ABLA.   No overt bleeding reported. Warfarin teaching session completed today.  Goal of Therapy:  INR 2-3    Plan:  1.  Warfarin 5 mg po x 1 today at 1800.  2.  Daily PT / INR while inpatient.  [name], PharmD, BCPS Pager: [phone]  10/12/2013  7:41 AM

## 2013-10-13 LAB — TYPE AND SCREEN
ABO/RH(D): O POS
ANTIBODY SCREEN: NEGATIVE
UNIT DIVISION: 0
Unit division: 0

## 2013-10-13 LAB — BASIC METABOLIC PANEL
BUN: 65 mg/dL — ABNORMAL HIGH (ref 6–23)
CALCIUM: 9.8 mg/dL (ref 8.4–10.5)
CO2: 25 mEq/L (ref 19–32)
Chloride: 98 mEq/L (ref 96–112)
Creatinine, Ser: 2.48 mg/dL — ABNORMAL HIGH (ref 0.50–1.35)
GFR calc Af Amer: 25 mL/min — ABNORMAL LOW (ref >90)
GFR, EST NON AFRICAN AMERICAN: 22 mL/min — AB (ref >90)
GLUCOSE: 131 mg/dL — AB (ref 70–99)
Potassium: 4.5 mEq/L (ref 3.7–5.3)
SODIUM: 137 meq/L (ref 137–147)

## 2013-10-13 LAB — CBC
HEMATOCRIT: 27 % — AB (ref 39.0–52.0)
Hemoglobin: 9.1 g/dL — ABNORMAL LOW (ref 13.0–17.0)
MCH: 29.4 pg (ref 26.0–34.0)
MCHC: 33.7 g/dL (ref 30.0–36.0)
MCV: 87.4 fL (ref 78.0–100.0)
Platelets: 209 10*3/uL (ref 150–400)
RBC: 3.09 MIL/uL — ABNORMAL LOW (ref 4.22–5.81)
RDW: 15.2 % (ref 11.5–15.5)
WBC: 11.7 10*3/uL — ABNORMAL HIGH (ref 4.0–10.5)

## 2013-10-13 LAB — PROTIME-INR
INR: 1.89 — ABNORMAL HIGH (ref 0.00–1.49)
Prothrombin Time: 21.1 seconds — ABNORMAL HIGH (ref 11.6–15.2)

## 2013-10-13 MED ORDER — WARFARIN SODIUM 2.5 MG PO TABS
2.5000 mg | ORAL_TABLET | Freq: Once | ORAL | Status: AC
Start: 2013-10-13 — End: 2013-10-13
  Administered 2013-10-13: 2.5 mg via ORAL
  Filled 2013-10-13: qty 1

## 2013-10-13 MED ORDER — IRBESARTAN 75 MG PO TABS
75.0000 mg | ORAL_TABLET | Freq: Every day | ORAL | Status: DC
  Administered 2013-10-14: 75 mg via ORAL
  Filled 2013-10-13: qty 1

## 2013-10-13 NOTE — Progress Notes (Signed)
   Subjective:  Patient reports pain as moderate.    Objective:   VITALS:   Filed Vitals:   10/12/13 1429 10/12/13 2019 10/12/13 2200 10/13/13 0513  BP: 154/80 165/93 164/80 159/87  Pulse: 113 111 101 111  Temp: 100.8 F (38.2 C) 99.9 F (37.7 C)  98.6 F (37 C)  TempSrc: Oral Oral  Oral  Resp: 17 16  18   Height:      Weight:      SpO2: 94% 94% 94% 94%    exam stable   Lab Results  Component Value Date   WBC 11.7* 10/13/2013   HGB 9.1* 10/13/2013   HCT 27.0* 10/13/2013   MCV 87.4 10/13/2013   PLT 209 10/13/2013     Assessment/Plan:  3 Days Post-Op   - Expected postop acute blood loss anemia - will monitor for symptoms - Up with PT - SNF - DVT ppx - SCDs, ambulation, warfarin - WBAT right lower extremity - Pain control - Discharge planning - SNF pending - Cr trending down, appreciate hospitalist assistance, ok to remove foley?  Marianna Payment 10/13/2013, 8:30 AM [phone]

## 2013-10-13 NOTE — Progress Notes (Signed)
[facility] PROGRESS NOTE  [name] TGG:[id number] DOB: Jun 25, 1923 DOA: 10/10/2013 PCP: [name], MD  Assessment/Plan: Principal Problem:   Arthritis of knee, right - Management per Ortho  Active Problems:  CKD - Pt currently at or near baseline creatinine - Should creatinine continue to trend up can consider holding nephrotoxic agents ARB and furosemide. - Will reassess next am - Will decrease ARB dose   Code Status: full Family Communication: Discussed directly with patient Disposition Plan: Per primary team. Currently patient near baseline and was ready for discharge from orthostatic standpoint he may be discharged.  Would recommend follow up with his PCP for continued monitoring of his serum creatinine   HPI/Subjective: No new complaints reported today  Objective: Filed Vitals:   10/13/13 1345  BP: 130/80  Pulse: 97  Temp: 100 F (37.8 C)  Resp: 16    Intake/Output Summary (Last 24 hours) at 10/13/13 1543 Last data filed at 10/13/13 1330  Gross per 24 hour  Intake   1080 ml  Output   1750 ml  Net   -670 ml   Filed Weights   10/10/13 1130  Weight: 88.905 kg (196 lb)    Exam:   General:  Pt in NAD, alert and awake  Cardiovascular: RRR, no MRG  Respiratory: CTA BL, no wheezes  Abdomen: soft, ND  Musculoskeletal: no cyanosis or clubbing   Data Reviewed: Basic Metabolic Panel:  Recent Labs Lab 10/11/13 0407 10/12/13 0528 10/13/13 0655  NA 139 137 137  K 4.6 4.5 4.5  CL 101 98 98  CO2 27 25 25   GLUCOSE 134* 126* 131*  BUN 61* 67* 65*  CREATININE 2.19* 2.62* 2.48*  CALCIUM 9.6 9.5 9.8   Liver Function Tests: No results found for this basename: AST, ALT, ALKPHOS, BILITOT, PROT, ALBUMIN,  in the last 168 hours No results found for this basename: LIPASE, AMYLASE,  in the last 168 hours No results found for this basename: AMMONIA,  in the last 168 hours CBC:  Recent Labs Lab 10/11/13 0407 10/12/13 0528 10/13/13 0750  WBC  7.8 10.7* 11.7*  HGB 7.3* 8.8* 9.1*  HCT 22.2* 26.2* 27.0*  MCV 89.5 87.3 87.4  PLT 211 200 209   Cardiac Enzymes: No results found for this basename: CKTOTAL, CKMB, CKMBINDEX, TROPONINI,  in the last 168 hours BNP (last 3 results) No results found for this basename: PROBNP,  in the last 8760 hours CBG: No results found for this basename: GLUCAP,  in the last 168 hours  No results found for this or any previous visit (from the past 240 hour(s)).   Studies: US Renal  10/12/2013   CLINICAL DATA:  Hypertension.  EXAM: RENAL/URINARY TRACT ULTRASOUND COMPLETE  COMPARISON:  None.  FINDINGS: Right Kidney:  Length: 9.8 cm. The kidney is slightly echogenic suggesting chronic medical renal disease. No hydronephrosis.  Left Kidney:  Length: 10.1 cm. The kidney is slightly echogenic suggesting chronic medical renal disease. No hydronephrosis.  Bladder:  Foley catheter in bladder.  Bladder nondistended.  IMPRESSION: Echogenic kidneys suggesting chronic medical renal disease. No hydronephrosis. No bladder distention. Foley catheter in bladder.   Electronically Signed   By: Marcello Moores  Register   On: 10/12/2013 16:05    Scheduled Meds: . allopurinol  100 mg Oral QPM  . docusate sodium  100 mg Oral BID  . ferrous sulfate  325 mg Oral Q breakfast  . furosemide  40 mg Oral q morning - 10a  . irbesartan  300 mg Oral Daily  .  multivitamin with minerals  1 tablet Oral q morning - 10a  . simvastatin  10 mg Oral q1800  . tamsulosin  0.4 mg Oral QHS  . warfarin  2.5 mg Oral ONCE-1800  . warfarin   Does not apply Once  . Warfarin - Pharmacist Dosing Inpatient   Does not apply q1800   Continuous Infusions: . sodium chloride 20 mL/hr at 10/12/13 0100    Time spent: > 20 minutes    Velvet Bathe  [facility] Pager [phone] If 7PM-7AM, please contact night-coverage at [email or web address], password [facility] 10/13/2013, 3:43 PM  LOS: 3 days

## 2013-10-13 NOTE — Progress Notes (Signed)
Per RN, d/c not likely today.  CSW to continue to follow.  Bernita Raisin, [place] [facility] [phone]

## 2013-10-13 NOTE — Progress Notes (Signed)
[place] for Warfarin Indication: VTE prophylaxis  Allergies  Allergen Reactions  . Aspirin Other (See Comments)    bleeding    Patient Measurements: Height: 5\' 5"  (165.1 cm) Weight: 196 lb (88.905 kg) IBW/kg (Calculated) : 61.5   Vital Signs: Temp: 98.6 F (37 C) (06/14 0513) Temp src: Oral (06/14 0513) BP: 159/87 mmHg (06/14 0513) Pulse Rate: 111 (06/14 0513)  Labs:  Recent Labs  10/11/13 0407 10/12/13 0528 10/13/13 0655 10/13/13 0750  HGB 7.3* 8.8*  --  9.1*  HCT 22.2* 26.2*  --  27.0*  PLT 211 200  --  209  LABPROT 14.4 15.2  --  21.1*  INR 1.14 1.23  --  1.89*  CREATININE 2.19* 2.62* 2.48*  --     Estimated Creatinine Clearance: 21.5 ml/min (by C-G formula based on Cr of 2.48).  Inpatient warfarin doses administered: 6/11-6/13:  5 mg, 5mg , 5mg   Assessment: 54 yoM s/p right TKA 6/11.  Pharmacy consulted to dose warfarin for VTE prophylaxis.  Of note, patient has a history of atrial flutter.  Patient does not take any oral anticoagulants PTA.  Baseline INR WNL.    6/13: Warfarin initiation in progress INR subtherapeutic but responding quickly now after 3 doses of 5mg  Hgb improved  - ABLA.   No overt bleeding reported. Warfarin teaching session completed 6/12  Goal of Therapy:  INR 2-3    Plan:  1.  Decrease warfarin 2.5 mg po x 1 today at 1800.  2.  Daily PT / INR while inpatient.  [name], PharmD, BCPS Pager: [phone]  10/13/2013  9:26 AM

## 2013-10-13 NOTE — Progress Notes (Signed)
Foley catheter remains in place as per order dated 10/11/2013,  "Do Not Remove Foley Catheter until Discontinue Foley Catheter Order is Written".  Will continue to monitor patient.   Christen Bame RN

## 2013-10-13 NOTE — Progress Notes (Signed)
Physical Therapy Treatment Patient Details Name: [name] MRN: [id number] DOB: [birth year] Today's Date: 10/13/2013    History of Present Illness R TKR    PT Comments    *Progressing with mobility, increased gait distance today. Improved sequencing, but still requires assist to fully advance RLE when walking. **  Follow Up Recommendations  SNF     Equipment Recommendations  None recommended by PT    Recommendations for Other Services OT consult     Precautions / Restrictions Precautions Precautions: Fall;Knee Restrictions Weight Bearing Restrictions: No    Mobility  Bed Mobility Overal bed mobility: Needs Assistance;+2 for physical assistance Bed Mobility: Sit to Supine     Supine to sit: +2 for physical assistance;Mod assist     General bed mobility comments: cues for sequence with physical assist to manage LEs and to raise trunk  Transfers Overall transfer level: Needs assistance Equipment used: Rolling walker (2 wheeled) Transfers: Sit to/from Stand Sit to Stand: +2 physical assistance;Mod assist         General transfer comment: cues for hand placement, assist to rise  Ambulation/Gait   Ambulation Distance (Feet): 28 Feet Assistive device: Rolling walker (2 wheeled) Gait Pattern/deviations: Decreased step length - right;Decreased step length - left;Wide base of support;Trunk flexed;Step-to pattern Gait velocity: decr   General Gait Details: cues for sequence, posture, position from RW, stride length, UE WB; physical assist to advance RLE   Stairs            Wheelchair Mobility    Modified Rankin (Stroke Patients Only)       Balance     Sitting balance-Leahy Scale: Fair       Standing balance-Leahy Scale: Poor                      Cognition Arousal/Alertness: Awake/alert Behavior During Therapy: WFL for tasks assessed/performed Overall Cognitive Status: Within Functional Limits for tasks assessed                       Exercises Total Joint Exercises Ankle Circles/Pumps: AROM;Both;Supine;15 reps Quad Sets: AROM;AAROM;10 reps;Supine;Both Short Arc Quad: AROM;Right;10 reps Heel Slides: AAROM;Right;Supine;15 reps Hip ABduction/ADduction: AAROM;Right;10 reps;Supine Goniometric ROM: AAROM R knee -10-45*    General Comments        Pertinent Vitals/Pain **6/10 R knee Premedicated, ice applied*    Home Living                      Prior Function            PT Goals (current goals can now be found in the care plan section) Acute Rehab PT Goals Patient Stated Goal: to be able to do my self care PT Goal Formulation: With patient/family Time For Goal Achievement: 10/25/13 Potential to Achieve Goals: Good Progress towards PT goals: Progressing toward goals    Frequency  7X/week    PT Plan Current plan remains appropriate    Co-evaluation             End of Session Equipment Utilized During Treatment: Gait belt Activity Tolerance: Patient tolerated treatment well Patient left: with call bell/phone within reach;in chair     Time: 1505-6979 PT Time Calculation (min): 24 min  Charges:  $Gait Training: 8-22 mins $Therapeutic Exercise: 8-22 mins                    G Codes:      Blondell Reveal [employer]  10/13/2013, 9:10 AM [phone]

## 2013-10-13 NOTE — Progress Notes (Signed)
Physical Therapy Treatment Patient Details Name: [name] MRN: [id number] DOB: 15-Oct-1923 Today's Date: 10/13/2013    History of Present Illness R TKR    PT Comments    **Improved sequencing with ambulation, improved advancement of RLE. *  Follow Up Recommendations  SNF     Equipment Recommendations  None recommended by PT    Recommendations for Other Services OT consult     Precautions / Restrictions Precautions Precautions: Fall;Knee Restrictions Weight Bearing Restrictions: No    Mobility  Bed Mobility Overal bed mobility: Needs Assistance;+2 for physical assistance Bed Mobility: Sit to Supine     Supine to sit: +2 for physical assistance;Mod assist Sit to supine: +2 for physical assistance;Mod assist   General bed mobility comments: cues for sequence with physical assist to manage LEs and control descent of trunk  Transfers Overall transfer level: Needs assistance Equipment used: Rolling walker (2 wheeled) Transfers: Sit to/from Stand Sit to Stand: +2 physical assistance;Mod assist         General transfer comment: cues for hand placement, assist to rise  Ambulation/Gait Ambulation/Gait assistance: +2 physical assistance Ambulation Distance (Feet): 28 Feet Assistive device: Rolling walker (2 wheeled) Gait Pattern/deviations: Step-to pattern;Trunk flexed;Wide base of support;Decreased step length - right;Decreased step length - left Gait velocity: decr   General Gait Details: cues for sequence, posture, position from RW, stride length, UE WB; pt improved advancement of RLE   Stairs            Wheelchair Mobility    Modified Rankin (Stroke Patients Only)       Balance     Sitting balance-Leahy Scale: Fair       Standing balance-Leahy Scale: Poor                      Cognition Arousal/Alertness: Awake/alert Behavior During Therapy: WFL for tasks assessed/performed Overall Cognitive Status: Within Functional Limits for  tasks assessed                      Exercises Total Joint Exercises Ankle Circles/Pumps: AROM;Both;Supine;15 reps Quad Sets: AROM;AAROM;10 reps;Supine;Both Short Arc Quad: AROM;Right;10 reps Heel Slides: AAROM;Right;Supine;15 reps Hip ABduction/ADduction: AAROM;Right;10 reps;Supine Straight Leg Raises: AAROM;Right;10 reps;Supine Goniometric ROM: AAROM R knee -10-45*    General Comments        Pertinent Vitals/Pain *3/10 R knee Ice applied**    Home Living                      Prior Function            PT Goals (current goals can now be found in the care plan section) Acute Rehab PT Goals Patient Stated Goal: to be able to do my self care PT Goal Formulation: With patient/family Time For Goal Achievement: 10/25/13 Potential to Achieve Goals: Good Progress towards PT goals: Progressing toward goals    Frequency  7X/week    PT Plan Current plan remains appropriate    Co-evaluation             End of Session Equipment Utilized During Treatment: Gait belt Activity Tolerance: Patient tolerated treatment well Patient left: with call bell/phone within reach;in bed;with family/visitor present     Time: 6503-5465 PT Time Calculation (min): 24 min  Charges:  $Gait Training: 8-22 mins $Therapeutic Exercise: 8-22 mins                    G Codes:  [name] 10/13/2013, 12:04 PM [phone]

## 2013-10-14 LAB — BASIC METABOLIC PANEL
BUN: 69 mg/dL — ABNORMAL HIGH (ref 6–23)
CALCIUM: 9.7 mg/dL (ref 8.4–10.5)
CO2: 26 mEq/L (ref 19–32)
CREATININE: 2.5 mg/dL — AB (ref 0.50–1.35)
Chloride: 99 mEq/L (ref 96–112)
GFR calc Af Amer: 25 mL/min — ABNORMAL LOW (ref >90)
GFR calc non Af Amer: 22 mL/min — ABNORMAL LOW (ref >90)
GLUCOSE: 105 mg/dL — AB (ref 70–99)
Potassium: 4.3 mEq/L (ref 3.7–5.3)
Sodium: 138 mEq/L (ref 137–147)

## 2013-10-14 LAB — PROTIME-INR
INR: 3.28 — AB (ref 0.00–1.49)
Prothrombin Time: 32.2 seconds — ABNORMAL HIGH (ref 11.6–15.2)

## 2013-10-14 MED ORDER — WARFARIN SODIUM 2.5 MG PO TABS: 2.5000 mg | ORAL_TABLET | Freq: Every day | ORAL | Status: DC

## 2013-10-14 MED ORDER — HYDROCODONE-ACETAMINOPHEN 5-325 MG PO TABS: 1.0000 | ORAL_TABLET | Freq: Four times a day (QID) | ORAL | Status: DC | PRN

## 2013-10-14 MED ORDER — IRBESARTAN 75 MG PO TABS: 75.0000 mg | ORAL_TABLET | Freq: Every day | ORAL | Status: DC

## 2013-10-14 NOTE — Progress Notes (Signed)
Clinical Social Work Department CLINICAL SOCIAL WORK PLACEMENT NOTE 10/14/2013  Patient:  [name],[name]  Account Number:  [id number] Admit date:  10/10/2013  Clinical Social Worker:  Werner Lean, LCSW  Date/time:  10/10/2013 03:18 PM  Clinical Social Work is seeking post-discharge placement for this patient at the following level of care:   SKILLED NURSING   (*CSW will update this form in Epic as items are completed)     Patient/family provided with [facility] Department of Clinical Social Work's list of facilities offering this level of care within the geographic area requested by the patient (or if unable, by the patient's family).  10/10/2013  Patient/family informed of their freedom to choose among providers that offer the needed level of care, that participate in Medicare, Medicaid or managed care program needed by the patient, have an available bed and are willing to accept the patient.    Patient/family informed of [facility]' ownership interest in [facility], as well as of the fact that they are under no obligation to receive care at this facility.  PASARR submitted to EDS on 10/10/2013 PASARR number received on 10/10/2013  FL2 transmitted to all facilities in geographic area requested by pt/family on  10/10/2013 FL2 transmitted to all facilities within larger geographic area on   Patient informed that his/her managed care company has contracts with or will negotiate with  certain facilities, including the following:     Patient/family informed of bed offers received:  10/10/2013 Patient chooses bed at [place] Physician recommends and patient chooses bed at    Patient to be transferred to [place] on  10/14/2013 Patient to be transferred to facility by [facility] Patient and family notified of transfer on 10/14/2013 Name of family member notified:  Mrs. Joya Gaskins : niece  The following physician request were entered in Epic:   Additional  Comments: Pt / family are in agreement with d/c to SNF today. NSG reviewed d/c summary, avs, scripts. Scripts were included in d/c packet.  Werner Lean LCSW [phone]

## 2013-10-14 NOTE — Progress Notes (Signed)
[facility] PROGRESS NOTE  Jalen Daluz ZHY:[id number] DOB: 1923/08/08 DOA: 10/10/2013 PCP: [name], MD  Assessment/Plan: Principal Problem:   Arthritis of knee, right - Management per Ortho 4 Days Post-Op Procedure(s) (LRB):  RIGHT TOTAL KNEE ARTHROPLASTY (Right)  Active Problems:  CKD - Pt currently at or near baseline creatinine - Will discontinue ARB given creatinine levels slightly above baseline. - reassess S creatinine next am. Unless discharged which S creatinine can be checked within the next week at [facility] facility.  Code Status: full Family Communication: Discussed directly with patient Disposition Plan: Per primary team. Currently patient near baseline and when ready for discharge from orthostatic standpoint he may be discharged.  Would recommend follow up with his PCP for continued monitoring of his serum creatinine   HPI/Subjective: No acute issues reported to me overnight.  Objective: Filed Vitals:   10/14/13 1314  BP: 158/78  Pulse:   Temp:   Resp:     Intake/Output Summary (Last 24 hours) at 10/14/13 1501 Last data filed at 10/14/13 1307  Gross per 24 hour  Intake   1200 ml  Output    825 ml  Net    375 ml   Filed Weights   10/10/13 1130  Weight: 88.905 kg (196 lb)    Exam:   General:  Pt in NAD, alert and awake  Cardiovascular: no cyanosis at extremities  Respiratory: no wheezes, no increased work of breathing  Abdomen: soft, ND  Musculoskeletal: no clubbing  Data Reviewed: Basic Metabolic Panel:  Recent Labs Lab 10/11/13 0407 10/12/13 0528 10/13/13 0655 10/14/13 0456  NA 139 137 137 138  K 4.6 4.5 4.5 4.3  CL 101 98 98 99  CO2 27 25 25 26   GLUCOSE 134* 126* 131* 105*  BUN 61* 67* 65* 69*  CREATININE 2.19* 2.62* 2.48* 2.50*  CALCIUM 9.6 9.5 9.8 9.7   Liver Function Tests: No results found for this basename: AST, ALT, ALKPHOS, BILITOT, PROT, ALBUMIN,  in the last 168 hours No results found for this basename:  LIPASE, AMYLASE,  in the last 168 hours No results found for this basename: AMMONIA,  in the last 168 hours CBC:  Recent Labs Lab 10/11/13 0407 10/12/13 0528 10/13/13 0750  WBC 7.8 10.7* 11.7*  HGB 7.3* 8.8* 9.1*  HCT 22.2* 26.2* 27.0*  MCV 89.5 87.3 87.4  PLT 211 200 209   Cardiac Enzymes: No results found for this basename: CKTOTAL, CKMB, CKMBINDEX, TROPONINI,  in the last 168 hours BNP (last 3 results) No results found for this basename: PROBNP,  in the last 8760 hours CBG: No results found for this basename: GLUCAP,  in the last 168 hours  No results found for this or any previous visit (from the past 240 hour(s)).   Studies: US Renal  10/12/2013   CLINICAL DATA:  Hypertension.  EXAM: RENAL/URINARY TRACT ULTRASOUND COMPLETE  COMPARISON:  None.  FINDINGS: Right Kidney:  Length: 9.8 cm. The kidney is slightly echogenic suggesting chronic medical renal disease. No hydronephrosis.  Left Kidney:  Length: 10.1 cm. The kidney is slightly echogenic suggesting chronic medical renal disease. No hydronephrosis.  Bladder:  Foley catheter in bladder.  Bladder nondistended.  IMPRESSION: Echogenic kidneys suggesting chronic medical renal disease. No hydronephrosis. No bladder distention. Foley catheter in bladder.   Electronically Signed   By: Marcello Moores  Register   On: 10/12/2013 16:05    Scheduled Meds: . allopurinol  100 mg Oral QPM  . docusate sodium  100 mg Oral BID  .  ferrous sulfate  325 mg Oral Q breakfast  . furosemide  40 mg Oral q morning - 10a  . multivitamin with minerals  1 tablet Oral q morning - 10a  . simvastatin  10 mg Oral q1800  . tamsulosin  0.4 mg Oral QHS  . warfarin   Does not apply Once  . Warfarin - Pharmacist Dosing Inpatient   Does not apply q1800   Continuous Infusions: . sodium chloride 20 mL/hr at 10/12/13 0100    Time spent: > 20 minutes    Velvet Bathe  [facility] Pager [phone] If 7PM-7AM, please contact night-coverage at [email or web address],  password [facility] 10/14/2013, 3:01 PM  LOS: 4 days

## 2013-10-14 NOTE — Discharge Instructions (Addendum)
Information on my medicine - Coumadin      This medication education was reviewed with me or my healthcare representative as part of my discharge preparation.  The pharmacist that spoke with me during my hospital stay was:  Absher, Julieta Bellini, RPH  Why was Coumadin prescribed for you? Coumadin was prescribed for you because you have a blood clot or a medical condition that can cause an increased risk of forming blood clots. Blood clots can cause serious health problems by blocking the flow of blood to the heart, lung, or brain. Coumadin can prevent harmful blood clots from forming. As a reminder your indication for Coumadin is:   Blood Clot Prevention After Orthopedic Surgery  What test will check on my response to Coumadin? While on Coumadin (warfarin) you will need to have an INR test regularly to ensure that your dose is keeping you in the desired range. The INR (international normalized ratio) number is calculated from the result of the laboratory test called prothrombin time (PT).  If an INR APPOINTMENT HAS NOT ALREADY BEEN MADE FOR YOU please schedule an appointment to have this lab work done by your health care provider within 7 days. Your INR goal is usually a number between:  2 to 3 or your provider may give you a more narrow range like 2-2.5.  Ask your health care provider during an office visit what your goal INR is.  What  do you need to  know  About  COUMADIN? Take Coumadin (warfarin) exactly as prescribed by your healthcare provider about the same time each day.  DO NOT stop taking without talking to the doctor who prescribed the medication.  Stopping without other blood clot prevention medication to take the place of Coumadin may increase your risk of developing a new clot or stroke.  Get refills before you run out.  What do you do if you miss a dose? If you miss a dose, take it as soon as you remember on the same day then continue your regularly scheduled regimen the next day.  Do  not take two doses of Coumadin at the same time.  Important Safety Information A possible side effect of Coumadin (Warfarin) is an increased risk of bleeding. You should call your healthcare provider right away if you experience any of the following:   Bleeding from an injury or your nose that does not stop.   Unusual colored urine (red or dark brown) or unusual colored stools (red or black).   Unusual bruising for unknown reasons.   A serious fall or if you hit your head (even if there is no bleeding).  Some foods or medicines interact with Coumadin (warfarin) and might alter your response to warfarin. To help avoid this:   Eat a balanced diet, maintaining a consistent amount of Vitamin K.   Notify your provider about major diet changes you plan to make.   Avoid alcohol or limit your intake to 1 drink for women and 2 drinks for men per day. (1 drink is 5 oz. wine, 12 oz. beer, or 1.5 oz. liquor.)  Make sure that ANY health care provider who prescribes medication for you knows that you are taking Coumadin (warfarin).  Also make sure the healthcare provider who is monitoring your Coumadin knows when you have started a new medication including herbals and non-prescription products.  Coumadin (Warfarin)  Major Drug Interactions  Increased Warfarin Effect Decreased Warfarin Effect  Alcohol (large quantities) Antibiotics (esp. Septra/Bactrim, Flagyl, Cipro) Amiodarone (Cordarone)  Aspirin (ASA) Cimetidine (Tagamet) Megestrol (Megace) NSAIDs (ibuprofen, naproxen, etc.) Piroxicam (Feldene) Propafenone (Rythmol SR) Propranolol (Inderal) Isoniazid (INH) Posaconazole (Noxafil) Barbiturates (Phenobarbital) Carbamazepine (Tegretol) Chlordiazepoxide (Librium) Cholestyramine (Questran) Griseofulvin Oral Contraceptives Rifampin Sucralfate (Carafate) Vitamin K   Coumadin (Warfarin) Major Herbal Interactions  Increased Warfarin Effect Decreased Warfarin Effect  Garlic Ginseng Ginkgo  biloba Coenzyme Q10 Green tea St. Johns wort    Coumadin (Warfarin) FOOD Interactions  Eat a consistent number of servings per week of foods HIGH in Vitamin K (1 serving =  cup)  Collards (cooked, or boiled & drained) Kale (cooked, or boiled & drained) Mustard greens (cooked, or boiled & drained) Parsley *serving size only =  cup Spinach (cooked, or boiled & drained) Swiss chard (cooked, or boiled & drained) Turnip greens (cooked, or boiled & drained)  Eat a consistent number of servings per week of foods MEDIUM-HIGH in Vitamin K (1 serving = 1 cup)  Asparagus (cooked, or boiled & drained) Broccoli (cooked, boiled & drained, or raw & chopped) Brussel sprouts (cooked, or boiled & drained) *serving size only =  cup Lettuce, raw (green leaf, endive, romaine) Spinach, raw Turnip greens, raw & chopped   These websites have more information on Coumadin (warfarin):  FailFactory.se; VeganReport.com.au;  Keep dressing clean dry and intact until Friday. May shower with dressing intact. On Friday remove dressing and shower, new dressing after showering. Slowly progress activities as tolerated.

## 2013-10-14 NOTE — Progress Notes (Signed)
RN called report to [facility] at [facility]. All questions answered.   Paperwork and prescription given to [facility].  [facility] transported patient to facility.

## 2013-10-14 NOTE — Clinical Documentation Improvement (Signed)
Thank you for your response to the [place] query! CKD4 is noted in 6/13 progress note.   However, in addition, it is noted the patient's Creatinine levels have increased from 2.19 on 6/12 to 2.50 today  This is an increase of 0.31 over 72 hours  Please provide a diagnosis associated with the above data and document findings in next progress note and discharge summary.  Acute Renal Failure/Acute Kidney Injury Acute on Chronic Renal Failure Other Condition   Thank You, Zoila Shutter ,RN Clinical Documentation Specialist:  [place] [employer]

## 2013-10-14 NOTE — Progress Notes (Signed)
Physical Therapy Treatment Patient Details Name: [name] MRN: [id number] DOB: 1923-08-20 Today's Date: 10/14/2013    History of Present Illness R TKR    PT Comments    POD # 4 applied KI as pt was unable to perform SLR.  Assisted OOB to amb limited distance.  Performed TKR TE's.  Then amb to BR plus back to recliner.   Follow Up Recommendations  SNF ([place])     Equipment Recommendations       Recommendations for Other Services       Precautions / Restrictions Precautions Precautions: Fall;Knee Precaution Comments: Instructed on use of KI Restrictions Weight Bearing Restrictions: No RLE Weight Bearing: Weight bearing as tolerated    Mobility  Bed Mobility Overal bed mobility: Needs Assistance Bed Mobility: Supine to Sit     Supine to sit: Mod assist;Max assist     General bed mobility comments: cues for sequence with physical assist to manage LEs   Transfers Overall transfer level: Needs assistance Equipment used: Rolling walker (2 wheeled) Transfers: Sit to/from Stand Sit to Stand: +2 physical assistance;Mod assist         General transfer comment: cues for hand placement, assist to rise.  Pt stated he also has a bad R shoulder  Ambulation/Gait Ambulation/Gait assistance: +2 physical assistance Ambulation Distance (Feet): 32 Feet (12, 10, 10) Assistive device: Rolling walker (2 wheeled)   Gait velocity: decr   General Gait Details: 25% VC's on proper sequencing, upright posture and proper walker to self distance.     Stairs            Wheelchair Mobility    Modified Rankin (Stroke Patients Only)       Balance                                    Cognition                            Exercises   Total Knee Replacement TE's 10 reps B LE ankle pumps 10 reps towel squeezes 10 reps knee presses 10 reps heel slides  10 reps SAQ's 10 reps SLR's 10 reps ABD Followed by ICE     General Comments         Pertinent Vitals/Pain C/o 3/10 pain    Home Living                      Prior Function            PT Goals (current goals can now be found in the care plan section) Progress towards PT goals: Progressing toward goals    Frequency  7X/week    PT Plan      Co-evaluation             End of Session Equipment Utilized During Treatment: Gait belt Activity Tolerance: Patient tolerated treatment well Patient left: with call bell/phone within reach;with family/visitor present;in chair     Time: 7035-0093 PT Time Calculation (min): 40 min  Charges:  $Gait Training: 23-37 mins $Therapeutic Activity: 8-22 mins                    G Codes:      [name]  PTA [facility]  Acute  Rehab Pager      [phone]

## 2013-10-14 NOTE — Progress Notes (Signed)
Subjective: 4 Days Post-Op Procedure(s) (LRB): RIGHT TOTAL KNEE ARTHROPLASTY (Right) Patient reports pain as mild.    Objective: Vital signs in last 24 hours: Temp:  [98.8 F (37.1 C)-100 F (37.8 C)] 99.5 F (37.5 C) (06/15 0519) Pulse Rate:  [97-107] 100 (06/15 0519) Resp:  [16-17] 16 (06/15 0519) BP: (130-155)/(72-86) 154/72 mmHg (06/15 0519) SpO2:  [97 %-99 %] 99 % (06/15 0519)  Intake/Output from previous day: 06/14 0701 - 06/15 0700 In: 1440 [P.O.:1440] Out: 1400 [Urine:1400] Intake/Output this shift: Total I/O In: 120 [P.O.:120] Out: -    Recent Labs  10/12/13 0528 10/13/13 0750  HGB 8.8* 9.1*    Recent Labs  10/12/13 0528 10/13/13 0750  WBC 10.7* 11.7*  RBC 3.00* 3.09*  HCT 26.2* 27.0*  PLT 200 209    Recent Labs  10/13/13 0655 10/14/13 0456  NA 137 138  K 4.5 4.3  CL 98 99  CO2 25 26  BUN 65* 69*  CREATININE 2.48* 2.50*  GLUCOSE 131* 105*  CALCIUM 9.8 9.7    Recent Labs  10/13/13 0750 10/14/13 0456  INR 1.89* 3.28*    Neurovascular intact Sensation intact distally Intact pulses distally Dorsiflexion/Plantar flexion intact Incision: dressing C/D/I Compartment soft  Assessment/Plan: 4 Days Post-Op Procedure(s) (LRB): RIGHT TOTAL KNEE ARTHROPLASTY (Right) Discharge to SNF Hold coumadin D/c foley  [name] 10/14/2013, 9:28 AM

## 2013-10-14 NOTE — Progress Notes (Signed)
[facility] for Warfarin Indication: VTE prophylaxis  Allergies  Allergen Reactions  . Aspirin Other (See Comments)    bleeding    Patient Measurements: Height: 5\' 5"  (165.1 cm) Weight: 196 lb (88.905 kg) IBW/kg (Calculated) : 61.5   Vital Signs: Temp: 99.5 F (37.5 C) (06/15 0519) Temp src: Oral (06/15 0519) BP: 154/72 mmHg (06/15 0519) Pulse Rate: 100 (06/15 0519)  Labs:  Recent Labs  10/12/13 0528 10/13/13 0655 10/13/13 0750 10/14/13 0456  HGB 8.8*  --  9.1*  --   HCT 26.2*  --  27.0*  --   PLT 200  --  209  --   LABPROT 15.2  --  21.1* 32.2*  INR 1.23  --  1.89* 3.28*  CREATININE 2.62* 2.48*  --  2.50*    Estimated Creatinine Clearance: 21.3 ml/min (by C-G formula based on Cr of 2.5).  Inpatient warfarin doses administered: 6/11-6/14:  5 mg, 5 mg, 5 mg, 2.5 mg  Assessment: 56 yoM s/p right TKA 6/11.  Pharmacy consulted to dose warfarin for VTE prophylaxis.  Of note, patient has a history of atrial flutter.  Patient does not take any oral anticoagulants PTA.  Baseline INR WNL.    6/15: INR supratherapeutic No overt bleeding reported in chart notes Concomitant allopurinol, simvastatin noted  Goal of Therapy:  INR 2-3    Plan:  1.  No warfarin today.  2.  Daily PT / INR while inpatient.  Clayburn Pert, PharmD, BCPS Pager: [phone] 10/14/2013  8:05 AM

## 2013-10-14 NOTE — Discharge Summary (Signed)
Patient ID: [name] MRN: [id number] DOB/AGE: 1923/06/10 80 y.o.  Admit date: 10/10/2013 Discharge date: 10/14/2013  Admission Diagnoses:  Principal Problem:   Arthritis of knee, right Active Problems:   Status post total knee replacement   Discharge Diagnoses:  Right total knee arthroplasty Acute on chronic kidney disease Acute blood loss anemia secondary to surgery requiring blood trans transfusion  Past Medical History  Diagnosis Date  . Hypertension   . Cancer 06-19-12    Hx. 15 yrs ago -seed implant-PSA remains elevated somewhat.  . Arthritis     Osteoarthritis(right shoulder)-knees, hip . s/p LTKA  . Transfusion history 06-19-12    history s/p '11- LTKA  . Gout   . Dysrhythmia 06-19-12    medical record note shows hx R)BBB-. A.Fib/ flutter-not present  . Frequency of urination   . Anemia   . Swelling     10-03-13 both legs swells, right is greater    Surgeries: Procedure(s): RIGHT TOTAL KNEE ARTHROPLASTY on 10/10/2013   Consultants:    Discharged Condition: Improved  Hospital Course: [name] is an 80 y.o. male who was admitted 10/10/2013 for operative treatment ofArthritis of knee, right. Patient has severe unremitting pain that affects sleep, daily activities, and work/hobbies. After pre-op clearance the patient was taken to the operating room on 10/10/2013 and underwent  Procedure(s): RIGHT TOTAL KNEE ARTHROPLASTY.    Patient was given perioperative antibiotics: Anti-infectives   Start     Dose/Rate Route Frequency Ordered Stop   10/10/13 1400  ceFAZolin (ANCEF) IVPB 1 g/50 mL premix     1 g 100 mL/hr over 30 Minutes Intravenous Every 6 hours 10/10/13 1129 10/10/13 2039   10/10/13 0542  ceFAZolin (ANCEF) IVPB 2 g/50 mL premix     2 g 100 mL/hr over 30 Minutes Intravenous On call to O.R. 10/10/13 0542 10/10/13 0743       Patient was given sequential compression devices, early ambulation, and chemoprophylaxis to prevent DVT.  Patient benefited  maximally from hospital stay .  Acute on chronic kidney disease requiring treatment and medication adjustment. ABLA secondary to surgery requiring blood transfusion with appropriate response.  Recent vital signs: Patient Vitals for the past 24 hrs:  BP Temp Temp src Pulse Resp SpO2  10/14/13 0519 154/72 mmHg 99.5 F (37.5 C) Oral 100 16 99 %  10/13/13 2103 155/86 mmHg 98.8 F (37.1 C) Oral 107 17 97 %  10/13/13 1345 130/80 mmHg 100 F (37.8 C) Oral 97 16 98 %     Recent laboratory studies:  Recent Labs  10/12/13 0528 10/13/13 0655 10/13/13 0750 10/14/13 0456  WBC 10.7*  --  11.7*  --   HGB 8.8*  --  9.1*  --   HCT 26.2*  --  27.0*  --   PLT 200  --  209  --   NA 137 137  --  138  K 4.5 4.5  --  4.3  CL 98 98  --  99  CO2 25 25  --  26  BUN 67* 65*  --  69*  CREATININE 2.62* 2.48*  --  2.50*  GLUCOSE 126* 131*  --  105*  INR 1.23  --  1.89* 3.28*  CALCIUM 9.5 9.8  --  9.7     Discharge Medications:     Medication List    STOP taking these medications       olmesartan-hydrochlorothiazide 40-12.5 MG per tablet  Commonly known as:  BENICAR HCT  TAKE these medications       acetaminophen 500 MG tablet  Commonly known as:  TYLENOL  Take 500 mg by mouth every 6 (six) hours as needed for moderate pain.     allopurinol 100 MG tablet  Commonly known as:  ZYLOPRIM  Take 100 mg by mouth every evening.     clobetasol cream 0.05 %  Commonly known as:  TEMOVATE  Apply 1 application topically 2 (two) times daily. Applying to both legs x 2 daily for swelling     ferrous sulfate 325 (65 FE) MG tablet  Take 325 mg by mouth daily with breakfast.     Fish Oil 1200 MG Caps  Take 1,200 mg by mouth at bedtime. Includes 360mg  of Omega-3.     furosemide 40 MG tablet  Commonly known as:  LASIX  Take 40 mg by mouth every morning.     HYDROcodone-acetaminophen 5-325 MG per tablet  Commonly known as:  NORCO/VICODIN  Take 1-2 tablets by mouth every 6 (six) hours as needed  for moderate pain.     irbesartan 75 MG tablet  Commonly known as:  AVAPRO  Take 1 tablet (75 mg total) by mouth daily.     multivitamin with minerals Tabs tablet  Take 1 tablet by mouth every morning.     pravastatin 20 MG tablet  Commonly known as:  PRAVACHOL  Take 20 mg by mouth at bedtime.     tamsulosin 0.4 MG Caps capsule  Commonly known as:  FLOMAX  Take 0.4 mg by mouth at bedtime.     warfarin 2.5 MG tablet  Commonly known as:  COUMADIN  Take 1 tablet (2.5 mg total) by mouth daily.        Diagnostic Studies: Dg Chest 2 View  10/03/2013   CLINICAL DATA:  Pre-op evaluation for a total knee replacement.  EXAM: CHEST  2 VIEW  COMPARISON:  Chest radiograph 06/19/2012  FINDINGS: Two views of the chest demonstrate chronic elevation of the right hemidiaphragm. The lungs are clear. Stable appearance of the heart and mediastinum. The trachea is midline. No acute bone abnormality.  IMPRESSION: No active cardiopulmonary disease.   Electronically Signed   By: Markus Daft M.D.   On: 10/03/2013 13:18   US Renal  10/12/2013   CLINICAL DATA:  Hypertension.  EXAM: RENAL/URINARY TRACT ULTRASOUND COMPLETE  COMPARISON:  None.  FINDINGS: Right Kidney:  Length: 9.8 cm. The kidney is slightly echogenic suggesting chronic medical renal disease. No hydronephrosis.  Left Kidney:  Length: 10.1 cm. The kidney is slightly echogenic suggesting chronic medical renal disease. No hydronephrosis.  Bladder:  Foley catheter in bladder.  Bladder nondistended.  IMPRESSION: Echogenic kidneys suggesting chronic medical renal disease. No hydronephrosis. No bladder distention. Foley catheter in bladder.   Electronically Signed   By: Marcello Moores  Register   On: 10/12/2013 16:05   Dg Knee Right Port  10/10/2013   CLINICAL DATA:  Postoperative right total knee replacement  EXAM: PORTABLE RIGHT KNEE - 1-2 VIEW  COMPARISON:  MRI of the lumbar spine dated August 29, 2008  FINDINGS: The patient has undergone right knee joint  replacement. Radiographic positioning of the prosthetic components is good. The interface with the native bone is normal. There is popliteal artery calcification. There are skin staples anteriorly.  IMPRESSION: The patient has undergone right total knee joint replacement without evidence of immediate postprocedure complication.   Electronically Signed   By: David  Martinique   On: 10/10/2013 10:09  Disposition: Skilled nursing facility.      Discharge Instructions   Elevate operative extremity    Complete by:  As directed   Encourage patient to wiggle toes often     Weight bearing as tolerated    Complete by:  As directed            Will need follow up with PCP to check/ monitor his BUN/Creatine levels. Hold Coumadin today due to hyper therapeutic INR. Recheck INR tomorrow.   SignedErskine Emery 10/14/2013, 9:17 AM

## 2013-10-15 ENCOUNTER — Non-Acute Institutional Stay (SKILLED_NURSING_FACILITY): Payer: Medicare Other | Admitting: Adult Health

## 2013-10-15 ENCOUNTER — Other Ambulatory Visit: Payer: Self-pay | Admitting: *Deleted

## 2013-10-15 ENCOUNTER — Encounter: Payer: Self-pay | Admitting: Adult Health

## 2013-10-15 DIAGNOSIS — M1710 Unilateral primary osteoarthritis, unspecified knee: Secondary | ICD-10-CM

## 2013-10-15 DIAGNOSIS — R609 Edema, unspecified: Secondary | ICD-10-CM

## 2013-10-15 DIAGNOSIS — N184 Chronic kidney disease, stage 4 (severe): Secondary | ICD-10-CM

## 2013-10-15 DIAGNOSIS — M1711 Unilateral primary osteoarthritis, right knee: Secondary | ICD-10-CM

## 2013-10-15 DIAGNOSIS — Z96659 Presence of unspecified artificial knee joint: Secondary | ICD-10-CM

## 2013-10-15 DIAGNOSIS — E785 Hyperlipidemia, unspecified: Secondary | ICD-10-CM

## 2013-10-15 DIAGNOSIS — I4892 Unspecified atrial flutter: Secondary | ICD-10-CM

## 2013-10-15 DIAGNOSIS — N039 Chronic nephritic syndrome with unspecified morphologic changes: Secondary | ICD-10-CM

## 2013-10-15 DIAGNOSIS — N189 Chronic kidney disease, unspecified: Secondary | ICD-10-CM

## 2013-10-15 DIAGNOSIS — N400 Enlarged prostate without lower urinary tract symptoms: Secondary | ICD-10-CM | POA: Insufficient documentation

## 2013-10-15 DIAGNOSIS — I129 Hypertensive chronic kidney disease with stage 1 through stage 4 chronic kidney disease, or unspecified chronic kidney disease: Secondary | ICD-10-CM | POA: Insufficient documentation

## 2013-10-15 DIAGNOSIS — M109 Gout, unspecified: Secondary | ICD-10-CM

## 2013-10-15 DIAGNOSIS — D631 Anemia in chronic kidney disease: Secondary | ICD-10-CM

## 2013-10-15 DIAGNOSIS — IMO0002 Reserved for concepts with insufficient information to code with codable children: Secondary | ICD-10-CM

## 2013-10-15 MED ORDER — METOPROLOL TARTRATE 25 MG PO TABS: 25.0000 mg | ORAL_TABLET | Freq: Two times a day (BID) | ORAL | Status: DC

## 2013-10-15 MED ORDER — HYDROCODONE-ACETAMINOPHEN 5-325 MG PO TABS: ORAL_TABLET | ORAL | Status: DC

## 2013-10-15 NOTE — Progress Notes (Signed)
Patient ID: [name], male   DOB: June 01, 1923, 80 y.o.   MRN: [id number]     [facility]  Allergies  Allergen Reactions  . Aspirin Other (See Comments)    bleeding     Chief Complaint  Patient presents with  . Hospitalization Follow-up    HPI:  He has been hospitalized for a right knee replacement. He has chronic stage IV kidney disease and hypertension. He is here for short term rehab; with his goal to return back home.   Past Medical History  Diagnosis Date  . Hypertension   . Cancer 06-19-12    Hx. 15 yrs ago -seed implant-PSA remains elevated somewhat.  . Arthritis     Osteoarthritis(right shoulder)-knees, hip . s/p LTKA  . Transfusion history 06-19-12    history s/p '11- LTKA  . Gout   . Dysrhythmia 06-19-12    medical record note shows hx R)BBB-. A.Fib/ flutter-not present  . Frequency of urination   . Anemia   . Swelling     10-03-13 both legs swells, right is greater    Past Surgical History  Procedure Laterality Date  . Joint replacement  06-19-12    '11 -LTKA  . Cataract extraction, bilateral    . Minor hemorrhoidectomy    . Radioactive seed implant  06-19-12    many yrs ago > 15 yrs  . Total hip arthroplasty Left 06/29/2012    Procedure: LEFT TOTAL HIP ARTHROPLASTY ANTERIOR APPROACH;  Surgeon: Mcarthur Rossetti, MD;  Location: [facility];  Service: Orthopedics;  Laterality: Left;  Left Total Hip Arthroplasty, Anterior Approach  . Total knee arthroplasty Right 10/10/2013    Procedure: RIGHT TOTAL KNEE ARTHROPLASTY;  Surgeon: Mcarthur Rossetti, MD;  Location: [facility];  Service: Orthopedics;  Laterality: Right;    VITAL SIGNS BP 148/88  Pulse 80  Ht 5\' 5"  (1.651 m)  Wt 204 lb (92.534 kg)  BMI 33.95 kg/m2   Patient's Medications  New Prescriptions   No medications on file  Previous Medications   ACETAMINOPHEN (TYLENOL) 500 MG TABLET    Take 500 mg by mouth every 6 (six) hours as needed for moderate pain.   ALLOPURINOL (ZYLOPRIM) 100 MG TABLET     Take 100 mg by mouth every evening.    ATORVASTATIN (LIPITOR) 10 MG TABLET    Take 10 mg by mouth daily.   CLOBETASOL CREAM (TEMOVATE) 0.05 %    Apply 1 application topically 2 (two) times daily. Applying to both legs x 2 daily for swelling   FERROUS SULFATE 325 (65 FE) MG TABLET    Take 325 mg by mouth daily with breakfast.   FUROSEMIDE (LASIX) 40 MG TABLET    Take 40 mg by mouth every morning.   HYDROCODONE-ACETAMINOPHEN (NORCO/VICODIN) 5-325 MG PER TABLET    Take one tablet by mouth every 6 hours as needed for moderate pain; Take two tablets by mouth every 6 hours as needed for severe pain   LOSARTAN (COZAAR) 25 MG TABLET    Take 25 mg by mouth daily.   Fish oil 1200 mg daily    MULTIPLE VITAMIN (MULTIVITAMIN WITH MINERALS) TABS    Take 1 tablet by mouth every morning.   TAMSULOSIN HCL (FLOMAX) 0.4 MG CAPS    Take 0.4 mg by mouth at bedtime.   WARFARIN (COUMADIN) 2.5 MG TABLET    Take 1 tablet (2.5 mg total) by mouth daily.  Modified Medications   No medications on file  Discontinued Medications  IRBESARTAN (AVAPRO) 75 MG TABLET    Take 1 tablet (75 mg total) by mouth daily.    SIGNIFICANT DIAGNOSTIC EXAMS  10-03-13: chest x-ray: No active cardiopulmonary disease.  10-10-13: right knee x-ray: The patient has undergone right total knee joint replacement without evidence of immediate postprocedure complication.    10-12-13: renal ultrasound: Echogenic kidneys suggesting chronic medical renal disease. No hydronephrosis. No bladder distention. Foley catheter in bladder.      LABS REVIEWED  10-03-13: wbc8.0; hgb 9.7; hct 29.2; mcv 88.5; plt 246; glucose 99; bun 53; creat 2.12; k+4.4; na++135;  10-13-13: wbc 11.7; hgb 9.1; hct 27.0; mcv 87.4; plt 209; glucose 131; bun 65; creat 2.48; k+4.5; na++137      Review of Systems  Constitutional: Negative for malaise/fatigue.  Respiratory: Positive for cough. Negative for shortness of breath.        Has an occasional nonproductive cough    Cardiovascular: Negative for chest pain, palpitations and leg swelling.  Gastrointestinal: Negative for heartburn, abdominal pain and constipation.  Musculoskeletal: Negative for joint pain and myalgias.  Skin: Negative.   Psychiatric/Behavioral: Negative for depression. The patient is not nervous/anxious.      Physical Exam  Constitutional: He is oriented to person, place, and time. He appears well-developed and well-nourished. No distress.  Neck: Neck supple. No JVD present.  Cardiovascular: Regular rhythm and intact distal pulses.   Is tachycardic   GI: Soft. Bowel sounds are normal. He exhibits no distension. There is no tenderness.  Musculoskeletal: He exhibits no edema.  Is able to move all extremities; is status post right knee replacement   Neurological: He is alert and oriented to person, place, and time.  Skin: Skin is warm and dry. He is not diaphoretic.  Right knee incision line without signs of infection present   Psychiatric: He has a normal mood and affect.       ASSESSMENT/ PLAN:  1. Osteoarthritis: is status post right knee replacement; will continue therapy as directed; will continue vicodin 5/325 mg 1 or 2 tabs every 6 hours as needed for pain; will continue to monitor his status   2. Hypertension: will continue cozaar 25 mg daily will monitor  3. Atrial flutter: he is tachycardic; will begin lopressor 25 mg twice daily with hold for systolic <725 and pulse <36. He is on chronic coumadin therapy; for his inr of 5 will hold his coumadin for 2 days then recheck will monitor his status.   4. Gout: no recent flares; will continue allopurinol 100 mg daily   5. Dyslipidemia: will continue lipitor 10 mg daily and fish oil 1200 mg daily   6. Edema: will continue lasix 40 mg daily   7. Anemia: will continue iron daily   8. BPH: will continue flomax daily    Will check cbc and bmp next draw      Time spent with patient 50 minutes       Ok Edwards  NP [facility]  Contact [phone] Monday through Friday 8am- 5pm  After hours call [phone]

## 2013-10-15 NOTE — Patient Instructions (Signed)
Hold for systolic b/p <498 and pulse <60

## 2013-10-15 NOTE — Telephone Encounter (Signed)
[facility] 

## 2013-10-18 ENCOUNTER — Non-Acute Institutional Stay (SKILLED_NURSING_FACILITY): Payer: Medicare Other | Admitting: Internal Medicine

## 2013-10-18 DIAGNOSIS — I4892 Unspecified atrial flutter: Secondary | ICD-10-CM

## 2013-10-18 DIAGNOSIS — D631 Anemia in chronic kidney disease: Secondary | ICD-10-CM

## 2013-10-18 DIAGNOSIS — M1710 Unilateral primary osteoarthritis, unspecified knee: Secondary | ICD-10-CM

## 2013-10-18 DIAGNOSIS — M1711 Unilateral primary osteoarthritis, right knee: Secondary | ICD-10-CM

## 2013-10-18 DIAGNOSIS — E785 Hyperlipidemia, unspecified: Secondary | ICD-10-CM

## 2013-10-18 DIAGNOSIS — IMO0002 Reserved for concepts with insufficient information to code with codable children: Secondary | ICD-10-CM

## 2013-10-18 DIAGNOSIS — N189 Chronic kidney disease, unspecified: Secondary | ICD-10-CM

## 2013-10-18 DIAGNOSIS — K5900 Constipation, unspecified: Secondary | ICD-10-CM

## 2013-10-18 DIAGNOSIS — N039 Chronic nephritic syndrome with unspecified morphologic changes: Secondary | ICD-10-CM

## 2013-10-18 NOTE — Progress Notes (Signed)
Patient ID: [name], male   DOB: 1924/02/26, 80 y.o.   MRN: [id number]     Facility: [facility]    PCP: [name], MD  Code Status: full code  Allergies  Allergen Reactions  . Aspirin Other (See Comments)    bleeding    Chief Complaint: new admit  HPI:  80 y/o male patient is here for STR after hospital admission from 10/10/13- 10/14/13 for right knee arthroplasty for his severe right knee OA. He tolerated the procedure well. He is working with therapy team and is moving around with walker and wheelchair. Pain is under control. Denies muscle spasm. Has been constipated. Had bowel movement on daily basis at home and now has it every 2-3 days with a lot of straining. Last bowel movement this am. No other concerns.   Review of Systems:  Constitutional: Negative for fever, chills, weight loss, malaise/fatigue and diaphoresis.  HENT: Negative for congestion, hearing loss and sore throat.   Eyes: Negative for eye pain, blurred vision, double vision and discharge.  Respiratory: Negative for cough, sputum production, shortness of breath and wheezing.   Cardiovascular: Negative for chest pain, palpitations, orthopnea. Has some leg swelling.  Gastrointestinal: Negative for heartburn, nausea, vomiting, abdominal pain Genitourinary: Negative for dysuria Musculoskeletal: Negative for back pain, falls Skin: Negative for itching and rash.  Neurological: Negative for dizziness, tingling, focal weakness and headaches.  Psychiatric/Behavioral: Negative for depression and memory loss. The patient is not nervous/anxious.     Past Medical History  Diagnosis Date  . Hypertension   . Cancer 06-19-12    Hx. 15 yrs ago -seed implant-PSA remains elevated somewhat.  . Arthritis     Osteoarthritis(right shoulder)-knees, hip . s/p LTKA  . Transfusion history 06-19-12    history s/p '11- LTKA  . Gout   . Dysrhythmia 06-19-12    medical record note shows hx R)BBB-. A.Fib/  flutter-not present  . Frequency of urination   . Anemia   . Swelling     10-03-13 both legs swells, right is greater  . Atrial flutter 02/10/2010    Qualifier: Diagnosis of  By: [name], MD, [name]    Past Surgical History  Procedure Laterality Date  . Joint replacement  06-19-12    '11 -LTKA  . Cataract extraction, bilateral    . Minor hemorrhoidectomy    . Radioactive seed implant  06-19-12    many yrs ago > 15 yrs  . Total hip arthroplasty Left 06/29/2012    Procedure: LEFT TOTAL HIP ARTHROPLASTY ANTERIOR APPROACH;  Surgeon: Mcarthur Rossetti, MD;  Location: [facility];  Service: Orthopedics;  Laterality: Left;  Left Total Hip Arthroplasty, Anterior Approach  . Total knee arthroplasty Right 10/10/2013    Procedure: RIGHT TOTAL KNEE ARTHROPLASTY;  Surgeon: Mcarthur Rossetti, MD;  Location: [facility];  Service: Orthopedics;  Laterality: Right;   Social History:   reports that he quit smoking about 33 years ago. His smoking use included Cigarettes. He smoked 0.00 packs per day. He does not have any smokeless tobacco history on file. He reports that he drinks about 1.8 ounces of alcohol per week. He reports that he does not use illicit drugs.  No family history on file.  Medications: Patient's Medications  New Prescriptions   No medications on file  Previous Medications   ACETAMINOPHEN (TYLENOL) 500 MG TABLET    Take 500 mg by mouth every 6 (six) hours as needed for moderate pain.   ALLOPURINOL (ZYLOPRIM)  100 MG TABLET    Take 100 mg by mouth every evening.    ATORVASTATIN (LIPITOR) 10 MG TABLET    Take 10 mg by mouth daily.   CLOBETASOL CREAM (TEMOVATE) 0.05 %    Apply 1 application topically 2 (two) times daily. Applying to both legs x 2 daily for swelling   FERROUS SULFATE 325 (65 FE) MG TABLET    Take 325 mg by mouth daily with breakfast.   FUROSEMIDE (LASIX) 40 MG TABLET    Take 40 mg by mouth every morning.   HYDROCODONE-ACETAMINOPHEN (NORCO/VICODIN) 5-325 MG PER  TABLET    Take one tablet by mouth every 6 hours as needed for moderate pain; Take two tablets by mouth every 6 hours as needed for severe pain   LOSARTAN (COZAAR) 25 MG TABLET    Take 25 mg by mouth daily.   METOPROLOL TARTRATE (LOPRESSOR) 25 MG TABLET    Take 1 tablet (25 mg total) by mouth 2 (two) times daily.   MULTIPLE VITAMIN (MULTIVITAMIN WITH MINERALS) TABS    Take 1 tablet by mouth every morning.   OMEGA-3 FATTY ACIDS (FISH OIL) 1200 MG CAPS    Take 1,200 mg by mouth daily.   TAMSULOSIN HCL (FLOMAX) 0.4 MG CAPS    Take 0.4 mg by mouth at bedtime.   WARFARIN (COUMADIN) 2.5 MG TABLET    Take 1 tablet (2.5 mg total) by mouth daily.  Modified Medications   No medications on file  Discontinued Medications   No medications on file     Physical Exam: Filed Vitals:   10/18/13 1449  BP: 150/88  Pulse: 88  Temp: 98.6 F (37 C)  Resp: 18  SpO2: 95%   General- elderly male in no acute distress Head- atraumatic, normocephalic Eyes- PERRLA, EOMI, no pallor, no icterus, no discharge Neck- no lymphadenopathy, no thyromegaly Cardiovascular- normal s1,s2, no murmurs/ rubs/ gallops Respiratory- bilateral clear to auscultation, no wheeze, no rhonchi, no crackles, no use of accessory muscles Abdomen- bowel sounds present, soft, non tender Musculoskeletal- able to move all 4 extremities, restricted ROM with pain in right knee. no spinal and paraspinal tenderness, using assistive device, 1+ right leg edema, dressing in place and incision site healing well Neurological- no focal deficit Skin- warm and dry Psychiatry- alert and oriented to person, place and time, normal mood and affect  Labs reviewed: Basic Metabolic Panel:  Recent Labs  10/12/13 0528 10/13/13 0655 10/14/13 0456  NA 137 137 138  K 4.5 4.5 4.3  CL 98 98 99  CO2 25 25 26   GLUCOSE 126* 131* 105*  BUN 67* 65* 69*  CREATININE 2.62* 2.48* 2.50*  CALCIUM 9.5 9.8 9.7   CBC:  Recent Labs  10/11/13 0407 10/12/13 0528  10/13/13 0750  WBC 7.8 10.7* 11.7*  HGB 7.3* 8.8* 9.1*  HCT 22.2* 26.2* 27.0*  MCV 89.5 87.3 87.4  PLT 211 200 209   Radiological Exams: Dg Chest 2 View  10/03/2013   CLINICAL DATA:  Pre-op evaluation for a total knee replacement.  EXAM: CHEST  2 VIEW  COMPARISON:  Chest radiograph 06/19/2012  FINDINGS: Two views of the chest demonstrate chronic elevation of the right hemidiaphragm. The lungs are clear. Stable appearance of the heart and mediastinum. The trachea is midline. No acute bone abnormality.  IMPRESSION: No active cardiopulmonary disease.   Electronically Signed   By: Markus Daft M.D.   On: 10/03/2013 13:18   US Renal  10/12/2013   CLINICAL DATA:  Hypertension.  EXAM: RENAL/URINARY TRACT ULTRASOUND COMPLETE  COMPARISON:  None.  FINDINGS: Right Kidney:  Length: 9.8 cm. The kidney is slightly echogenic suggesting chronic medical renal disease. No hydronephrosis.  Left Kidney:  Length: 10.1 cm. The kidney is slightly echogenic suggesting chronic medical renal disease. No hydronephrosis.  Bladder:  Foley catheter in bladder.  Bladder nondistended.  IMPRESSION: Echogenic kidneys suggesting chronic medical renal disease. No hydronephrosis. No bladder distention. Foley catheter in bladder.   Electronically Signed   By: Marcello Moores  Register   On: 10/12/2013 16:05   Dg Knee Right Port  10/10/2013   CLINICAL DATA:  Postoperative right total knee replacement  EXAM: PORTABLE RIGHT KNEE - 1-2 VIEW  COMPARISON:  MRI of the lumbar spine dated August 29, 2008  FINDINGS: The patient has undergone right knee joint replacement. Radiographic positioning of the prosthetic components is good. The interface with the native bone is normal. There is popliteal artery calcification. There are skin staples anteriorly.  IMPRESSION: The patient has undergone right total knee joint replacement without evidence of immediate postprocedure complication.   Electronically Signed   By: David  Martinique   On: 10/10/2013 10:09     Assessment/Plan  Osteoarthritis status post right knee replacement. Will have him work with physical therapy and occupational therapy team to help with gait training and muscle strengthening exercises.fall precautions. Skin care. Encourage to be out of bed. Continue current pain regimen of  vicodin 5/325 mg 1 or 2 tabs every 6 hours as needed for pain. On coumadin which will provide dvt prophylaxis. cotninue lasix to help with edema and monitor bmp  Atrial flutter Rate controlled with lopressor 25 mg bid for now. Continue coumadin with goal inr 2-3  Constipation In setting of limited mobility and use of opioids. Will have him on senna-s 1 tab daily and miralax 17 g po daily prn, reassess  Hypertension continue cozaar 25 mg daily   Hyperlipidemia will continue lipitor 10 mg daily and fish oil 1200 mg daily   Anemia Monitor cbc esp post op and continue iron daily    Family/ staff Communication: reviewed care plan with patient and nursing supervisor   Goals of care: STR   Labs/tests ordered: cbc, bmp    Blanchie Serve, MD  [facility] [phone] (Monday-Friday 8 am - 5 pm) [phone] (afterhours)

## 2013-10-30 ENCOUNTER — Non-Acute Institutional Stay (SKILLED_NURSING_FACILITY): Payer: Medicare Other | Admitting: Adult Health

## 2013-10-30 DIAGNOSIS — Z471 Aftercare following joint replacement surgery: Secondary | ICD-10-CM

## 2013-10-30 DIAGNOSIS — I10 Essential (primary) hypertension: Secondary | ICD-10-CM

## 2013-10-30 DIAGNOSIS — I4891 Unspecified atrial fibrillation: Secondary | ICD-10-CM

## 2013-10-30 DIAGNOSIS — M1711 Unilateral primary osteoarthritis, right knee: Secondary | ICD-10-CM

## 2013-10-30 DIAGNOSIS — Z96651 Presence of right artificial knee joint: Secondary | ICD-10-CM

## 2013-10-30 DIAGNOSIS — N184 Chronic kidney disease, stage 4 (severe): Secondary | ICD-10-CM

## 2013-10-30 DIAGNOSIS — I129 Hypertensive chronic kidney disease with stage 1 through stage 4 chronic kidney disease, or unspecified chronic kidney disease: Secondary | ICD-10-CM

## 2013-10-30 DIAGNOSIS — IMO0002 Reserved for concepts with insufficient information to code with codable children: Secondary | ICD-10-CM

## 2013-10-30 DIAGNOSIS — I509 Heart failure, unspecified: Secondary | ICD-10-CM

## 2013-10-30 DIAGNOSIS — M1710 Unilateral primary osteoarthritis, unspecified knee: Secondary | ICD-10-CM

## 2013-10-30 DIAGNOSIS — Z96659 Presence of unspecified artificial knee joint: Secondary | ICD-10-CM

## 2013-11-05 ENCOUNTER — Encounter: Payer: Self-pay | Admitting: Adult Health

## 2013-11-05 MED ORDER — LOSARTAN POTASSIUM 25 MG PO TABS: 50.0000 mg | ORAL_TABLET | Freq: Every day | ORAL | Status: DC

## 2013-11-05 NOTE — Progress Notes (Signed)
Patient ID: [name], male   DOB: 1924/01/01, 80 y.o.   MRN: [id number]     [facility]  Allergies  Allergen Reactions  . Aspirin Other (See Comments)    bleeding     Chief Complaint  Patient presents with  . Discharge Note    HPI:  He is being discharged to home with home health for pt/ot/rn-coumadin monitoring with inr due 11-04-13. He will not need any dme. He will need his prescriptions to be written. His blood pressure reading have all been elevated and will need to have cozaar dose increased.  He had been hospitalized for a right knee replacement.    Past Medical History  Diagnosis Date  . Hypertension   . Cancer 06-19-12    Hx. 15 yrs ago -seed implant-PSA remains elevated somewhat.  . Arthritis     Osteoarthritis(right shoulder)-knees, hip . s/p LTKA  . Transfusion history 06-19-12    history s/p '11- LTKA  . Gout   . Dysrhythmia 06-19-12    medical record note shows hx R)BBB-. A.Fib/ flutter-not present  . Frequency of urination   . Anemia   . Swelling     10-03-13 both legs swells, right is greater  . Atrial flutter 02/10/2010    Qualifier: Diagnosis of  By: [name], MD, [name]     Past Surgical History  Procedure Laterality Date  . Joint replacement  06-19-12    '11 -LTKA  . Cataract extraction, bilateral    . Minor hemorrhoidectomy    . Radioactive seed implant  06-19-12    many yrs ago > 15 yrs  . Total hip arthroplasty Left 06/29/2012    Procedure: LEFT TOTAL HIP ARTHROPLASTY ANTERIOR APPROACH;  Surgeon: Mcarthur Rossetti, MD;  Location: [facility];  Service: Orthopedics;  Laterality: Left;  Left Total Hip Arthroplasty, Anterior Approach  . Total knee arthroplasty Right 10/10/2013    Procedure: RIGHT TOTAL KNEE ARTHROPLASTY;  Surgeon: Mcarthur Rossetti, MD;  Location: [facility];  Service: Orthopedics;  Laterality: Right;    VITAL SIGNS BP 160/82  Pulse 88  Ht 5\' 5"  (1.651 m)  Wt 186 lb 3.2 oz (84.46 kg)  BMI 30.99  kg/m2   Patient's Medications  New Prescriptions   No medications on file  Previous Medications   ACETAMINOPHEN (TYLENOL) 500 MG TABLET    Take 500 mg by mouth every 6 (six) hours as needed for moderate pain.   ALLOPURINOL (ZYLOPRIM) 100 MG TABLET    Take 100 mg by mouth every evening.    ATORVASTATIN (LIPITOR) 10 MG TABLET    Take 10 mg by mouth daily.   CLOBETASOL CREAM (TEMOVATE) 0.05 %    Apply 1 application topically 2 (two) times daily. Applying to both legs x 2 daily for swelling   FERROUS SULFATE 325 (65 FE) MG TABLET    Take 325 mg by mouth daily with breakfast.   FUROSEMIDE (LASIX) 40 MG TABLET    Take 40 mg by mouth every morning.   HYDROCODONE-ACETAMINOPHEN (NORCO/VICODIN) 5-325 MG PER TABLET    Take one tablet by mouth every 6 hours as needed for moderate pain; Take two tablets by mouth every 6 hours as needed for severe pain   LOSARTAN (COZAAR) 25 MG TABLET    Take 25 mg by mouth daily.   METOPROLOL TARTRATE (LOPRESSOR) 25 MG TABLET    Take 1 tablet (25 mg total) by mouth 2 (two) times daily.   MULTIPLE VITAMIN (MULTIVITAMIN WITH MINERALS) TABS  Take 1 tablet by mouth every morning.   OMEGA-3 FATTY ACIDS (FISH OIL) 1200 MG CAPS    Take 1,200 mg by mouth daily.   TAMSULOSIN HCL (FLOMAX) 0.4 MG CAPS    Take 0.4 mg by mouth at bedtime.   WARFARIN (COUMADIN) 2.5 MG TABLET    Take 1 tablet (2.5 mg total) by mouth daily.  Modified Medications   No medications on file  Discontinued Medications   No medications on file    SIGNIFICANT DIAGNOSTIC EXAMS   10-03-13: chest x-ray: No active cardiopulmonary disease.  10-10-13: right knee x-ray: The patient has undergone right total knee joint replacement without evidence of immediate postprocedure complication.    10-12-13: renal ultrasound: Echogenic kidneys suggesting chronic medical renal disease. No hydronephrosis. No bladder distention. Foley catheter in bladder.      LABS REVIEWED  10-03-13: wbc8.0; hgb 9.7; hct 29.2; mcv  88.5; plt 246; glucose 99; bun 53; creat 2.12; k+4.4; na++135;  10-13-13: wbc 11.7; hgb 9.1; hct 27.0; mcv 87.4; plt 209; glucose 131; bun 65; creat 2.48; k+4.5; na++137      Review of Systems  Constitutional: Negative for malaise/fatigue.  Respiratory:negative for cough . Negative for shortness of breath.    Cardiovascular: Negative for chest pain, palpitations and leg swelling.  Gastrointestinal: Negative for heartburn, abdominal pain and constipation.  Musculoskeletal: Negative for joint pain and myalgias.  Skin: Negative.   Psychiatric/Behavioral: Negative for depression. The patient is not nervous/anxious.      Physical Exam  Constitutional: He is oriented to person, place, and time. He appears well-developed and well-nourished. No distress.  Neck: Neck supple. No JVD present.  Cardiovascular: Regular rhythm and intact distal pulses.   Is tachycardic   GI: Soft. Bowel sounds are normal. He exhibits no distension. There is no tenderness.  Musculoskeletal: He exhibits no edema.  Is able to move all extremities; is status post right knee replacement   Neurological: He is alert and oriented to person, place, and time.  Skin: Skin is warm and dry. He is not diaphoretic.   Psychiatric: He has a normal mood and affect.       ASSESSMENT/ PLAN:  Will discharge to home with home health for pt/ot/rn-coumadin monitoring; he will need inr on 11-04-13. Will not need any dme. His prescriptions have been written with a dose adjustment of his cozaar to 50 mg daily    Time spent with patient 40 minutes   Ok Edwards NP [facility]  Contact [phone] Monday through Friday 8am- 5pm  After hours call [phone]

## 2013-12-03 ENCOUNTER — Other Ambulatory Visit: Payer: Self-pay | Admitting: Adult Health

## 2013-12-09 ENCOUNTER — Other Ambulatory Visit: Payer: Self-pay | Admitting: Adult Health

## 2013-12-24 ENCOUNTER — Other Ambulatory Visit: Payer: Self-pay | Admitting: Adult Health

## 2014-01-20 ENCOUNTER — Other Ambulatory Visit: Payer: Self-pay | Admitting: Adult Health

## 2014-01-24 ENCOUNTER — Ambulatory Visit (INDEPENDENT_AMBULATORY_CARE_PROVIDER_SITE_OTHER): Payer: Medicare Other | Admitting: Podiatry

## 2014-01-24 ENCOUNTER — Encounter: Payer: Self-pay | Admitting: Podiatry

## 2014-01-24 VITALS — BP 159/77 | HR 84 | Resp 18

## 2014-01-24 DIAGNOSIS — M79609 Pain in unspecified limb: Secondary | ICD-10-CM

## 2014-01-24 DIAGNOSIS — B351 Tinea unguium: Secondary | ICD-10-CM

## 2014-01-24 DIAGNOSIS — M79676 Pain in unspecified toe(s): Secondary | ICD-10-CM

## 2014-01-24 NOTE — Progress Notes (Signed)
   Subjective:    Patient ID: [name], male    DOB: [birth year], 80 y.o.   MRN: [id number]  HPI I AM HERE TO SEE ABOUT THESE TOENAILS SOME OF THEM ARE COMING OFF AND THE RIGHT BIG TOENAIL IS DARK AND BRITTLE   [name], 80 year old male, since the office today for painful elongated toenails as well as discoloration of his nails. He states a couple weeks ago his right big toe nail fell off. He denies any redness or drainage from around the nail sites. Nails are painful with shoe gear. No other complaints at this time.    Review of Systems  Hematological: Bruises/bleeds easily.  All other systems reviewed and are negative.      Objective:   Physical Exam AAO x 3, NAD DP/PT pulses palpable b/l. CRT < 3sec. Mild chronic bilateral lower extremity edema. Protective sensation intact the Semmes Weinstein monofilament, vibratory sensation intact, Achilles tendon reflex intact. Nails hypertrophic, dystrophic, elongated, yellow-brown discoloration x9. Right hallux nail with evidence of starting to grow back. Left hallux nail is loose and underlying nailbed along the distal aspect and firmly appeared proximally. No surrounding erythema or drainage from the nail sites. No clinical signs of infection. No open skin lesions or pre-ulcer lesions. MMT 5/5, ROM WNL No calf pain, swelling, warmth.     Assessment & Plan:  80 year old male with symptomatic onychomycosis. -Various treatment options discussed including alternatives, risks, complications. -Nails were debrided x9 without complications to patient comfort. -Patient was asking about medications to help treat the nail fungus. Recommended over-the-counter therapy, fungi-nail. Directed on use and side effects. Her to stop immediately if any are to occur he wishes to proceed to treatment.  -Discussed the importance of daily foot inspection. -Followup in 3 months or sooner if any palms are to arise or any changes symptoms. In the meantime call  any questions, concerns.

## 2014-01-24 NOTE — Patient Instructions (Signed)
Onychomycosis/Fungal Toenails  WHAT IS IT? An infection that lies within the keratin of your nail plate that is caused by a fungus.  WHY ME? Fungal infections affect all ages, sexes, races, and creeds.  There may be many factors that predispose you to a fungal infection such as age, coexisting medical conditions such as diabetes, or an autoimmune disease; stress, medications, fatigue, genetics, etc.  Bottom line: fungus thrives in a warm, moist environment and your shoes offer such a location.  IS IT CONTAGIOUS? Theoretically, yes.  You do not want to share shoes, nail clippers or files with someone who has fungal toenails.  Walking around barefoot in the same room or sleeping in the same bed is unlikely to transfer the organism.  It is important to realize, however, that fungus can spread easily from one nail to the next on the same foot.  HOW DO WE TREAT THIS?  There are several ways to treat this condition.  Treatment may depend on many factors such as age, medications, pregnancy, liver and kidney conditions, etc.  It is best to ask your doctor which options are available to you.  1. No treatment.   Unlike many other medical concerns, you can live with this condition.  However for many people this can be a painful condition and may lead to ingrown toenails or a bacterial infection.  It is recommended that you keep the nails cut short to help reduce the amount of fungal nail. 2. Topical treatment.  These range from herbal remedies to prescription strength nail lacquers.  About 40-50% effective, topicals require twice daily application for approximately 9 to 12 months or until an entirely new nail has grown out.  The most effective topicals are medical grade medications available through physicians offices. 3. Oral antifungal medications.  With an 80-90% cure rate, the most common oral medication requires 3 to 4 months of therapy and stays in your system for a year as the new nail grows out.  Oral  antifungal medications do require blood work to make sure it is a safe drug for you.  A liver function panel will be performed prior to starting the medication and after the first month of treatment.  It is important to have the blood work performed to avoid any harmful side effects.  In general, this medication safe but blood work is required. 4. Laser Therapy.  This treatment is performed by applying a specialized laser to the affected nail plate.  This therapy is noninvasive, fast, and non-painful.  It is not covered by insurance and is therefore, out of pocket.  The results have been very good with a 80-95% cure rate.  [facility] is the only practice in the area to offer this therapy. 5. Permanent Nail Avulsion.  Removing the entire nail so that a new nail will not grow back.   Over the counter medication for nail fungus is called fungi-nail.

## 2014-04-11 ENCOUNTER — Other Ambulatory Visit (HOSPITAL_COMMUNITY): Payer: Self-pay | Admitting: Urology

## 2014-04-11 DIAGNOSIS — C61 Malignant neoplasm of prostate: Secondary | ICD-10-CM

## 2014-04-18 ENCOUNTER — Ambulatory Visit: Payer: Medicare Other | Admitting: Podiatry

## 2014-04-22 ENCOUNTER — Encounter (HOSPITAL_COMMUNITY)
Admission: RE | Admit: 2014-04-22 | Discharge: 2014-04-22 | Disposition: A | Discharge status: Home / Self Care | Payer: Medicare Other | Source: Ambulatory Visit | Attending: Urology | Admitting: Urology

## 2014-04-22 DIAGNOSIS — C61 Malignant neoplasm of prostate: Secondary | ICD-10-CM | POA: Diagnosis present

## 2014-04-22 MED ORDER — TECHNETIUM TC 99M MEDRONATE IV KIT
26.9000 | PACK | Freq: Once | INTRAVENOUS | Status: AC | PRN
  Administered 2014-04-22: 26.9 via INTRAVENOUS

## 2014-05-15 ENCOUNTER — Encounter (HOSPITAL_COMMUNITY): Payer: Self-pay | Admitting: Orthopaedic Surgery

## 2014-06-10 NOTE — Progress Notes (Signed)
erron

## 2014-06-11 ENCOUNTER — Telehealth: Payer: Self-pay | Admitting: Hematology

## 2014-06-11 ENCOUNTER — Encounter: Payer: Self-pay | Admitting: Cardiovascular Disease

## 2014-06-11 ENCOUNTER — Ambulatory Visit (INDEPENDENT_AMBULATORY_CARE_PROVIDER_SITE_OTHER): Payer: Medicare Other | Admitting: Cardiovascular Disease

## 2014-06-11 ENCOUNTER — Encounter: Payer: Medicare Other | Admitting: Cardiovascular Disease

## 2014-06-11 VITALS — HR 85 | Ht 65.0 in | Wt 199.8 lb

## 2014-06-11 DIAGNOSIS — I129 Hypertensive chronic kidney disease with stage 1 through stage 4 chronic kidney disease, or unspecified chronic kidney disease: Secondary | ICD-10-CM

## 2014-06-11 DIAGNOSIS — D631 Anemia in chronic kidney disease: Secondary | ICD-10-CM

## 2014-06-11 DIAGNOSIS — R609 Edema, unspecified: Secondary | ICD-10-CM

## 2014-06-11 DIAGNOSIS — I4510 Unspecified right bundle-branch block: Secondary | ICD-10-CM

## 2014-06-11 DIAGNOSIS — E785 Hyperlipidemia, unspecified: Secondary | ICD-10-CM

## 2014-06-11 DIAGNOSIS — I484 Atypical atrial flutter: Secondary | ICD-10-CM

## 2014-06-11 DIAGNOSIS — I480 Paroxysmal atrial fibrillation: Secondary | ICD-10-CM

## 2014-06-11 DIAGNOSIS — N189 Chronic kidney disease, unspecified: Secondary | ICD-10-CM

## 2014-06-11 DIAGNOSIS — N184 Chronic kidney disease, stage 4 (severe): Secondary | ICD-10-CM

## 2014-06-11 MED ORDER — FUROSEMIDE 40 MG PO TABS: 40.0000 mg | ORAL_TABLET | Freq: Two times a day (BID) | ORAL | Status: DC

## 2014-06-11 NOTE — Assessment & Plan Note (Signed)
Resolved No palpitations ECG with NSR  Given age no anticoagulation for now

## 2014-06-11 NOTE — Telephone Encounter (Signed)
Lt mess for pt regarding appt 06/20/14 at 10:30am w/Feng Dx: anemia Referring Dr.Sanford

## 2014-06-11 NOTE — Assessment & Plan Note (Signed)
Significant f/u labs nephrology consider starting Aranasp

## 2014-06-11 NOTE — Patient Instructions (Signed)
Your physician recommends that you schedule a follow-up appointment in: [place] physician has recommended you make the following change in your medication:  [facility] physician has requested that you have an echocardiogram. Echocardiography is a painless test that uses sound waves to create images of your heart. It provides your doctor with information about the size and shape of your heart and how well your heart's chambers and valves are working. This procedure takes approximately one hour. There are no restrictions for this procedure.   Your physician has requested that you have a lower  extremity venous duplex. This test is an ultrasound of the veins in the legs or arms. It looks at venous blood flow that carries blood from the heart to the legs or arms. Allow one hour for a Lower Venous exam. Allow thirty minutes for an Upper Venous exam. There are no restrictions or special instructions.

## 2014-06-11 NOTE — Assessment & Plan Note (Signed)
Chronic stable no high grade AV block observe

## 2014-06-11 NOTE — Assessment & Plan Note (Signed)
Increase lasix to 40 bid  Echo to assess RV/LV function LE duplex 2 weeks after edema less r/o DVT given history of left hip and bilateral TKR;s  F/U BMET with Dr Joelyn Oms

## 2014-06-11 NOTE — Progress Notes (Signed)
Patient ID: [name], male   DOB: 1924/01/07, 81 y.o.   MRN: [id number]    81 y.o. previously seen by Dr Olevia Perches  History of HTN and PAF  Had been on coumadin in past.  History of false positive myovue and normal cath in 2011  Sees Dr Joelyn Oms For CRF Last Cr 2.8 with recent stopping of ACE/diuretic Anemic with Hb 7.8  Has had both knees and left hip replaced by Dr Ninfa Linden  Since last knee has had increasing LE edema.  Only on 40 mg lasix daily.  Legs heavier and painful No history of DVT.  No palpitations chest pain Mild exertional dyspnea.  Still driving.  Walks with cane Compliant with meds.  Has not been on blood thinner in years      ROS: Denies fever, malais, weight loss, blurry vision, decreased visual acuity, cough, sputum, SOB, hemoptysis, pleuritic pain, palpitaitons, heartburn, abdominal pain, melena, lower extremity edema, claudication, or rash.  All other systems reviewed and negative   General: Affect appropriate Elderly frail black male  HEENT: normal Neck supple with no adenopathy JVP normal no bruits no thyromegaly Lungs clear with no wheezing and good diaphragmatic motion Heart:  S1/S2 no murmur,rub, gallop or click PMI normal Abdomen: benighn, BS positve, no tenderness, no AAA no bruit.  No HSM or HJR Distal pulses intact with no bruits Plus 3 bilateral  Edema  Post bilateral TKR  Neuro non-focal Skin warm and dry No muscular weakness  Medications Current Outpatient Prescriptions  Medication Sig Dispense Refill  . acetaminophen (TYLENOL) 500 MG tablet Take 500 mg by mouth every 6 (six) hours as needed for moderate pain.    Marland Kitchen allopurinol (ZYLOPRIM) 100 MG tablet Take 100 mg by mouth every evening.     Marland Kitchen atorvastatin (LIPITOR) 10 MG tablet Take 10 mg by mouth daily.    . clobetasol cream (TEMOVATE) 3.23 % Apply 1 application topically 2 (two) times daily. Applying to both legs x 2 daily for swelling    . ferrous sulfate 325 (65 FE) MG tablet Take 325 mg by  mouth daily with breakfast.    . furosemide (LASIX) 40 MG tablet Take 40 mg by mouth every morning.    Marland Kitchen HYDROcodone-acetaminophen (NORCO/VICODIN) 5-325 MG per tablet Take one tablet by mouth every 6 hours as needed for moderate pain; Take two tablets by mouth every 6 hours as needed for severe pain 240 tablet 0  . Multiple Vitamin (MULTIVITAMIN WITH MINERALS) TABS Take 1 tablet by mouth every morning.    . Omega-3 Fatty Acids (FISH OIL) 1200 MG CAPS Take 1,200 mg by mouth daily.    . pravastatin (PRAVACHOL) 20 MG tablet     . Tamsulosin HCl (FLOMAX) 0.4 MG CAPS Take 0.4 mg by mouth at bedtime.    . traMADol (ULTRAM) 50 MG tablet      No current facility-administered medications for this visit.   Facility-Administered Medications Ordered in Other Visits  Medication Dose Route Frequency Provider Last Rate Last Dose  . lactated ringers infusion    Continuous PRN Sherry Ruffing, CRNA 0 mL/hr at 08/16/13 0730      Allergies Aspirin  Family History: History reviewed. No pertinent family history.  Social History: History   Social History  . Marital Status: Widowed    Spouse Name: N/A  . Number of Children: N/A  . Years of Education: N/A   Occupational History  . Not on file.   Social History Main Topics  . Smoking  status: Former Smoker    Types: Cigarettes    Quit date: 06/19/1980  . Smokeless tobacco: Not on file  . Alcohol Use: 1.8 oz/week    3 Shots of liquor per week     Comment: 10 -12 ounces daily  . Drug Use: No  . Sexual Activity: Not Currently   Other Topics Concern  . Not on file   Social History Narrative    Past Surgical History  Procedure Laterality Date  . Joint replacement  06-19-12    '11 -LTKA  . Cataract extraction, bilateral    . Minor hemorrhoidectomy    . Radioactive seed implant  06-19-12    many yrs ago > 15 yrs  . Total hip arthroplasty Left 06/29/2012    Procedure: LEFT TOTAL HIP ARTHROPLASTY ANTERIOR APPROACH;  Surgeon: Mcarthur Rossetti, MD;  Location: [facility];  Service: Orthopedics;  Laterality: Left;  Left Total Hip Arthroplasty, Anterior Approach  . Total knee arthroplasty Right 10/10/2013    Procedure: RIGHT TOTAL KNEE ARTHROPLASTY;  Surgeon: Mcarthur Rossetti, MD;  Location: [facility];  Service: Orthopedics;  Laterality: Right;    Past Medical History  Diagnosis Date  . Hypertension   . Cancer 06-19-12    Hx. 15 yrs ago -seed implant-PSA remains elevated somewhat.  . Arthritis     Osteoarthritis(right shoulder)-knees, hip . s/p LTKA  . Transfusion history 06-19-12    history s/p '11- LTKA  . Gout   . Dysrhythmia 06-19-12    medical record note shows hx R)BBB-. A.Fib/ flutter-not present  . Frequency of urination   . Anemia   . Swelling     10-03-13 both legs swells, right is greater  . Atrial flutter 02/10/2010    Qualifier: Diagnosis of  By: [name], MD, [name]     Electrocardiogram:  SR RBBB PVC  Rate 85    Assessment and Plan

## 2014-06-11 NOTE — Assessment & Plan Note (Signed)
BP ok for now  F/U Cr wit Dr Joelyn Oms may get worse with increasing lasix but LE edema needs to be Rx  Encouraged him to f/u with Dr Ninfa Linden to make sure TKR;s ok

## 2014-06-12 ENCOUNTER — Encounter (HOSPITAL_COMMUNITY): Payer: Medicare Other

## 2014-06-13 ENCOUNTER — Telehealth: Payer: Self-pay | Admitting: Hematology

## 2014-06-16 ENCOUNTER — Other Ambulatory Visit (HOSPITAL_COMMUNITY): Payer: Medicare Other

## 2014-06-19 ENCOUNTER — Ambulatory Visit (HOSPITAL_BASED_OUTPATIENT_CLINIC_OR_DEPARTMENT_OTHER): Payer: Medicare Other | Admitting: Radiology

## 2014-06-19 DIAGNOSIS — R600 Localized edema: Secondary | ICD-10-CM

## 2014-06-19 DIAGNOSIS — Z87891 Personal history of nicotine dependence: Secondary | ICD-10-CM

## 2014-06-19 DIAGNOSIS — R609 Edema, unspecified: Secondary | ICD-10-CM | POA: Insufficient documentation

## 2014-06-19 DIAGNOSIS — E669 Obesity, unspecified: Secondary | ICD-10-CM | POA: Insufficient documentation

## 2014-06-19 NOTE — Progress Notes (Signed)
Echocardiogram performed.  

## 2014-06-20 ENCOUNTER — Encounter: Payer: Self-pay | Admitting: Hematology

## 2014-06-20 ENCOUNTER — Ambulatory Visit (HOSPITAL_COMMUNITY)
Admission: RE | Admit: 2014-06-20 | Discharge: 2014-06-20 | Disposition: A | Discharge status: Home / Self Care | Payer: Medicare Other | Source: Ambulatory Visit | Attending: Hematology | Admitting: Hematology

## 2014-06-20 ENCOUNTER — Ambulatory Visit (HOSPITAL_BASED_OUTPATIENT_CLINIC_OR_DEPARTMENT_OTHER): Payer: Medicare Other

## 2014-06-20 ENCOUNTER — Telehealth: Payer: Self-pay | Admitting: Hematology

## 2014-06-20 ENCOUNTER — Ambulatory Visit: Payer: Medicare Other

## 2014-06-20 ENCOUNTER — Ambulatory Visit (HOSPITAL_BASED_OUTPATIENT_CLINIC_OR_DEPARTMENT_OTHER): Payer: Medicare Other | Admitting: Hematology

## 2014-06-20 VITALS — BP 142/58 | HR 98 | Temp 98.9°F | Resp 18 | Ht 65.0 in | Wt 201.5 lb

## 2014-06-20 DIAGNOSIS — R609 Edema, unspecified: Secondary | ICD-10-CM

## 2014-06-20 DIAGNOSIS — F1099 Alcohol use, unspecified with unspecified alcohol-induced disorder: Secondary | ICD-10-CM

## 2014-06-20 DIAGNOSIS — M79609 Pain in unspecified limb: Secondary | ICD-10-CM

## 2014-06-20 DIAGNOSIS — R600 Localized edema: Secondary | ICD-10-CM | POA: Insufficient documentation

## 2014-06-20 DIAGNOSIS — N184 Chronic kidney disease, stage 4 (severe): Secondary | ICD-10-CM

## 2014-06-20 DIAGNOSIS — I509 Heart failure, unspecified: Secondary | ICD-10-CM

## 2014-06-20 DIAGNOSIS — I10 Essential (primary) hypertension: Secondary | ICD-10-CM

## 2014-06-20 DIAGNOSIS — C61 Malignant neoplasm of prostate: Secondary | ICD-10-CM

## 2014-06-20 DIAGNOSIS — D649 Anemia, unspecified: Secondary | ICD-10-CM

## 2014-06-20 LAB — COMPREHENSIVE METABOLIC PANEL (CC13)
ALT: 14 U/L (ref 0–55)
ANION GAP: 14 meq/L — AB (ref 3–11)
AST: 25 U/L (ref 5–34)
Albumin: 3.2 g/dL — ABNORMAL LOW (ref 3.5–5.0)
Alkaline Phosphatase: 120 U/L (ref 40–150)
BUN: 61.5 mg/dL — ABNORMAL HIGH (ref 7.0–26.0)
CHLORIDE: 106 meq/L (ref 98–109)
CO2: 18 meq/L — AB (ref 22–29)
Calcium: 10.3 mg/dL (ref 8.4–10.4)
Creatinine: 3.2 mg/dL (ref 0.7–1.3)
EGFR: 19 mL/min/{1.73_m2} — AB (ref >90)
GLUCOSE: 106 mg/dL (ref 70–140)
POTASSIUM: 4.6 meq/L (ref 3.5–5.1)
Sodium: 137 mEq/L (ref 136–145)
Total Bilirubin: 0.38 mg/dL (ref 0.20–1.20)
Total Protein: 7.1 g/dL (ref 6.4–8.3)

## 2014-06-20 LAB — CBC & DIFF AND RETIC
BASO%: 0.3 % (ref 0.0–2.0)
Basophils Absolute: 0 10*3/uL (ref 0.0–0.1)
EOS%: 0.7 % (ref 0.0–7.0)
Eosinophils Absolute: 0.1 10*3/uL (ref 0.0–0.5)
HEMATOCRIT: 25.2 % — AB (ref 38.4–49.9)
HEMOGLOBIN: 8.3 g/dL — AB (ref 13.0–17.1)
Immature Retic Fract: 6.3 % (ref 3.00–10.60)
LYMPH%: 22 % (ref 14.0–49.0)
MCH: 31.1 pg (ref 27.2–33.4)
MCHC: 32.9 g/dL (ref 32.0–36.0)
MCV: 94.4 fL (ref 79.3–98.0)
MONO#: 0.6 10*3/uL (ref 0.1–0.9)
MONO%: 8 % (ref 0.0–14.0)
NEUT#: 5.1 10*3/uL (ref 1.5–6.5)
NEUT%: 69 % (ref 39.0–75.0)
NRBC: 0 % (ref 0–0)
PLATELETS: 207 10*3/uL (ref 140–400)
RBC: 2.67 10*6/uL — AB (ref 4.20–5.82)
RDW: 15.6 % — AB (ref 11.0–14.6)
RETIC %: 1.38 % (ref 0.80–1.80)
Retic Ct Abs: 36.85 10*3/uL (ref 34.80–93.90)
WBC: 7.3 10*3/uL (ref 4.0–10.3)
lymph#: 1.6 10*3/uL (ref 0.9–3.3)

## 2014-06-20 LAB — IRON AND TIBC CHCC
%SAT: 11 % — AB (ref 20–55)
IRON: 29 ug/dL — AB (ref 42–163)
TIBC: 260 ug/dL (ref 202–409)
UIBC: 231 ug/dL (ref 117–376)

## 2014-06-20 LAB — MORPHOLOGY: PLT EST: ADEQUATE

## 2014-06-20 LAB — TSH CHCC: TSH: 2.928 m[IU]/L (ref 0.320–4.118)

## 2014-06-20 LAB — FERRITIN CHCC: Ferritin: 694 ng/ml — ABNORMAL HIGH (ref 22–316)

## 2014-06-20 LAB — LACTATE DEHYDROGENASE (CC13): LDH: 217 U/L (ref 125–245)

## 2014-06-20 NOTE — Progress Notes (Signed)
Spoke with Katharine Look in [place] vascular lab - Pt. Is negative for DVT in both legs and right arm.   Called and spoke with Dr. Burr Medico and gave her results.  Also let her know that pt's creatinine is 3.2.  She will call patient later.  Patient requests that we call his niece Hoyle Sauer @ [phone] as he does not hear well.   OK per Dr. Burr Medico to cancel doppler for Monday - done.  Pt. Signed ROI and this was faxed to [facility] - Dr. Karsten Ro to obtain medical records. Spoke with [place] and she will fax them.

## 2014-06-20 NOTE — Progress Notes (Signed)
[place]  [phone] Fax:(336) [place] consult Note   Patient Care Team: [name], MD as PCP - General [name], MD as Consulting Physician (Urology) 06/20/2014  CHIEF COMPLAINTS/PURPOSE OF CONSULTATION:  Anemia   HISTORY OF PRESENTING ILLNESS:  [facility] 81 y.o. male with past medical history of HTN, recently diagnosed to CHF with EF of 20-25%, prostate cancer, currently on treatment for elevated PSA, is here because of anemia   He was found to have abnormal CBC from at least 2010, with Hb around 11, over the past 5 years, his Hb has been 6-11, he had bilateral knee surgery and a left shoulder surgery, and required blood transfusion once.  He denied any bleeding episodes including hematochezia, melana, hemoptysis, hematuria or epitaxis. No mucosal bleeding or easy bruising. His last colonoscopy was more than 5 years, which was normal per pt.   He has developed bilateral lower extremity edema in the past few months, and a new onset right arm edema. He has mild to moderate dyspnea on exertion. He was evaluated by cardiologist last week, and echocardiogram was to yesterday which showed EF 25-30%. He lives alone, was still able to handle his daily living, but was not able to get out of a chair this morning without assistance.  He denies recent chest pain on exertion, shortness of breath on minimal exertion, pre-syncopal episodes, or palpitations. The patient was prescribed oral iron supplements and he takes 1 pill daily for the past 4 years, but did not help much.   MEDICAL HISTORY:  Past Medical History  Diagnosis Date  . Hypertension   . Cancer 06-19-12    Hx. 15 yrs ago -seed implant-PSA remains elevated somewhat.  . Arthritis     Osteoarthritis(right shoulder)-knees, hip . s/p LTKA  . Transfusion history 06-19-12    history s/p '11- LTKA  . Gout   . Dysrhythmia 06-19-12    medical record note shows hx R)BBB-. A.Fib/  flutter-not present  . Frequency of urination   . Anemia   . Swelling     10-03-13 both legs swells, right is greater  . Atrial flutter 02/10/2010    Qualifier: Diagnosis of  By: [name], MD, [name]     SURGICAL HISTORY: Past Surgical History  Procedure Laterality Date  . Joint replacement  06-19-12    '11 -LTKA  . Cataract extraction, bilateral    . Minor hemorrhoidectomy    . Radioactive seed implant  06-19-12    many yrs ago > 15 yrs  . Total hip arthroplasty Left 06/29/2012    Procedure: LEFT TOTAL HIP ARTHROPLASTY ANTERIOR APPROACH;  Surgeon: Mcarthur Rossetti, MD;  Location: [facility];  Service: Orthopedics;  Laterality: Left;  Left Total Hip Arthroplasty, Anterior Approach  . Total knee arthroplasty Right 10/10/2013    Procedure: RIGHT TOTAL KNEE ARTHROPLASTY;  Surgeon: Mcarthur Rossetti, MD;  Location: [facility];  Service: Orthopedics;  Laterality: Right;    SOCIAL HISTORY: History   Social History  . Marital Status: Widowed    Spouse Name: N/A  . Number of Children: One daughter lives in [place]  . Years of Education: N/A   Occupational History  . He is retired from post service    Social History Main Topics  . Smoking status: Former Smoker    Types: Cigarettes    Quit date: 06/19/1980  . Smokeless tobacco: Not on file  . Alcohol Use: 1.8 oz/week    3 Shots  of liquor per week     Comment: 10 -12 ounces daily  . Drug Use: No  . Sexual Activity: Not Currently   Other Topics Concern  . Not on file   Social History Narrative    FAMILY HISTORY: Family History  Problem Relation Age of Onset  . Cancer Brother 51    colon cancer   . Cancer Brother     colon cancer ?   Marland Kitchen Cancer Brother     colon cancer     ALLERGIES:  is allergic to aspirin.  MEDICATIONS:  Current Outpatient Prescriptions  Medication Sig Dispense Refill  . acetaminophen (TYLENOL) 500 MG tablet Take 500 mg by mouth every 6 (six) hours as needed for moderate pain.    Marland Kitchen  acetaminophen-codeine (TYLENOL #3) 300-30 MG per tablet   0  . allopurinol (ZYLOPRIM) 100 MG tablet Take 100 mg by mouth every evening.     . ferrous sulfate 325 (65 FE) MG tablet Take 325 mg by mouth daily with breakfast.    . furosemide (LASIX) 40 MG tablet Take 1 tablet (40 mg total) by mouth 2 (two) times daily. 60 tablet 6  . Multiple Vitamin (MULTIVITAMIN WITH MINERALS) TABS Take 1 tablet by mouth every morning.    . Omega-3 Fatty Acids (FISH OIL) 1200 MG CAPS Take 1,200 mg by mouth daily.    . pravastatin (PRAVACHOL) 20 MG tablet Take 20 mg by mouth daily.     . Tamsulosin HCl (FLOMAX) 0.4 MG CAPS Take 0.4 mg by mouth at bedtime.    . traMADol (ULTRAM) 50 MG tablet Take 50 mg by mouth as needed.     . enzalutamide (XTANDI) 40 MG capsule Take 160 mg by mouth daily. Take 4 tablets at one time daily.     No current facility-administered medications for this visit.   Facility-Administered Medications Ordered in Other Visits  Medication Dose Route Frequency Provider Last Rate Last Dose  . lactated ringers infusion    Continuous PRN Sherry Ruffing, CRNA 0 mL/hr at 08/16/13 0730      REVIEW OF SYSTEMS:   Constitutional: Denies fevers, chills or abnormal night sweats Eyes: Denies blurriness of vision, double vision or watery eyes Ears, nose, mouth, throat, and face: Denies mucositis or sore throat Respiratory: Denies cough, dyspnea or wheezes Cardiovascular: Denies palpitation, chest discomfort or lower extremity swelling Gastrointestinal:  Denies nausea, heartburn or change in bowel habits Skin: Denies abnormal skin rashes Lymphatics: Denies new lymphadenopathy or easy bruising Neurological:Denies numbness, tingling or new weaknesses Behavioral/Psych: Mood is stable, no new changes  All other systems were reviewed with the patient and are negative.  PHYSICAL EXAMINATION: ECOG PERFORMANCE STATUS: 3 - Symptomatic, >50% confined to bed  Filed Vitals:   06/20/14 1054  BP: 142/58    Pulse: 98  Temp: 98.9 F (37.2 C)  Resp: 18   Filed Weights   06/20/14 1054  Weight: 201 lb 8 oz (91.4 kg)    GENERAL:alert, no distress and comfortable SKIN: skin color, texture, turgor are normal, no rashes or significant lesions EYES: normal, conjunctiva are pink and non-injected, sclera clear OROPHARYNX:no exudate, no erythema and lips, buccal mucosa, and tongue normal  NECK: supple, thyroid normal size, non-tender, without nodularity LYMPH:  no palpable lymphadenopathy in the cervical, axillary or inguinal LUNGS: clear to auscultation and percussion with normal breathing effort HEART: regular rate & rhythm and no murmurs and no lower extremity edema ABDOMEN:abdomen soft, non-tender and normal bowel sounds Musculoskeletal:no cyanosis of  digits and no clubbing  PSYCH: alert & oriented x 3 with fluent speech NEURO: no focal motor/sensory deficits  LABORATORY DATA:  I have reviewed the data as listed CBC Latest Ref Rng 06/20/2014 10/13/2013 10/12/2013  WBC 4.0 - 10.3 10e3/uL 7.3 11.7(H) 10.7(H)  Hemoglobin 13.0 - 17.1 g/dL 8.3(L) 9.1(L) 8.8(L)  Hematocrit 38.4 - 49.9 % 25.2(L) 27.0(L) 26.2(L)  Platelets 140 - 400 10e3/uL 207 209 200    CMP Latest Ref Rng 06/20/2014 10/14/2013 10/13/2013  Glucose 70 - 140 mg/dl 106 105(H) 131(H)  BUN 7.0 - 26.0 mg/dL 61.5(H) 69(H) 65(H)  Creatinine 0.7 - 1.3 mg/dL 3.2(HH) 2.50(H) 2.48(H)  Sodium 136 - 145 mEq/L 137 138 137  Potassium 3.5 - 5.1 mEq/L 4.6 4.3 4.5  Chloride 96 - 112 mEq/L - 99 98  CO2 22 - 29 mEq/L 18(L) 26 25  Calcium 8.4 - 10.4 mg/dL 10.3 9.7 9.8  Total Protein 6.4 - 8.3 g/dL 7.1 - -  Total Bilirubin 0.20 - 1.20 mg/dL 0.38 - -  Alkaline Phos 40 - 150 U/L 120 - -  AST 5 - 34 U/L 25 - -  ALT 0 - 55 U/L 14 - -     RADIOGRAPHIC STUDIES: I have personally reviewed the radiological images as listed and agreed with the findings in the report. No results found.  ECHO 06/19/2014 Impressions:  - Severe global reduction in LV  function; grade 1 diastolic dysfunction; mild LAE; moderately reduced RV function; mild TR with severely elevated pulmonary pressure.  ASSESSMENT & PLAN:  81 year old African-American male, with multiple comorbidities, present to my clinic to evaluate his anemia.  1. Normocytic hypo-productive anemia -His hemoglobin is 8.3, with MCV 94, absolute reticular count 36, consistent with normocytic hypo-productive anemia. -I'll check his ferritin and iron level, TSH, transfer to an receptor, SPEP/UPEP with immunofixation -He has chronic kidney disease, which is likely contributes to his anemia.  -He has moderate to heavy alcohol drinking history, I'll obtain a abdominal ultrasound to evaluate his liver and spleen. I'll also check his hepatitis B and C -The pains on the above test results, I'll decide if he needs a bone marrow biopsy. -He probably needs blood transfusion, I'll set up for him next time. Giving his heart failure, I'll do so infusion 1 unit at one time. -Alcohol cessation were discussed with patient, his willing to stop.  2. CKD stage 4 -His creatinine is worse than 8 months ago, possible related to his recent diuretics. -He will follow-up with his primary care physician and his cardiologist.  3. Prostate cancer, probable disease recurrence with a high PSA -I'll contact his urologist office Dr. Karsten Ro obtain his records. Per patient, he recently had a bone scan which was negative for disease. -He is on Lupron and and and enzalutamide, managed by Dr. Karsten Ro.   4. CHF with EF 25-30% -Follow up with his cardiologist.  -Anemia may contributes to his heart failure, he needs treatment for his anemia.  5. Bilateral lower extremity and right upper extremity edema -I obtained an ultrasound Doppler today which was negative for DVT. -He is on diuretics.  Follow-up: Return to clinic in 2 weeks, with repeated CBC and type and cross. Consider blood transfusion with 1 unit each time  twice with Lasix.   All questions were answered. The patient knows to call the clinic with any problems, questions or concerns. I spent 40 minutes counseling the patient face to face. The total time spent in the appointment was 45 minutes and more  than 50% was on counseling.     [name], MD 06/20/2014 7:11 PM

## 2014-06-20 NOTE — Progress Notes (Signed)
Bilateral lower extremity venous duplex completed:  No evidence of DVT, superficial thrombosis, or Baker's cyst.   

## 2014-06-20 NOTE — Progress Notes (Signed)
Checked in new pt with no financial concerns at this time.  Pt has 2 insurances so financial assistance may not be needed but he has Raquel's card for any billing questions or concerns.

## 2014-06-20 NOTE — Telephone Encounter (Signed)
gv adn printed appt sched and avs for pt for May.....Marland Kitchensent pt to lab

## 2014-06-20 NOTE — Progress Notes (Signed)
Right upper extremity venous duplex completed:  No evidence of DVT or superficial thrombosis.    

## 2014-06-23 ENCOUNTER — Encounter (HOSPITAL_COMMUNITY): Payer: Medicare Other

## 2014-06-25 ENCOUNTER — Other Ambulatory Visit: Payer: Self-pay | Admitting: *Deleted

## 2014-06-25 DIAGNOSIS — N289 Disorder of kidney and ureter, unspecified: Secondary | ICD-10-CM

## 2014-06-25 LAB — HEPATITIS C ANTIBODY: HCV Ab: NEGATIVE

## 2014-06-25 LAB — TESTOSTERONE, FREE, TOTAL, SHBG
SEX HORMONE BINDING: 131 nmol/L — AB (ref 22–77)
TESTOSTERONE FREE: 1.6 pg/mL — AB (ref 47.0–244.0)
TESTOSTERONE: 25 ng/dL — AB (ref 300–890)
Testosterone-% Free: 0.7 % — ABNORMAL LOW (ref 1.6–2.9)

## 2014-06-25 LAB — SPEP & IFE WITH QIG
ALPHA-1-GLOBULIN: 6.1 % — AB (ref 2.9–4.9)
Albumin ELP: 50.6 % — ABNORMAL LOW (ref 55.8–66.1)
Alpha-2-Globulin: 11.8 % (ref 7.1–11.8)
BETA 2: 6.8 % — AB (ref 3.2–6.5)
Beta Globulin: 6.5 % (ref 4.7–7.2)
GAMMA GLOBULIN: 18.2 % (ref 11.1–18.8)
IGG (IMMUNOGLOBIN G), SERUM: 1550 mg/dL (ref 650–1600)
IGM, SERUM: 60 mg/dL (ref 41–251)
IgA: 342 mg/dL (ref 68–379)
Total Protein, Serum Electrophoresis: 6.8 g/dL (ref 6.0–8.3)

## 2014-06-25 LAB — FOLATE RBC: RBC Folate: 1475 ng/mL (ref >280)

## 2014-06-25 LAB — METHYLMALONIC ACID, SERUM: Methylmalonic Acid, Quant: 199 nmol/L (ref 87–318)

## 2014-06-25 LAB — ERYTHROPOIETIN: Erythropoietin: 7.5 m[IU]/mL (ref 2.6–18.5)

## 2014-06-25 LAB — ANA: ANA: NEGATIVE

## 2014-06-25 LAB — HEPATITIS B SURFACE ANTIBODY,QUALITATIVE: HEP B S AB: NEGATIVE

## 2014-06-25 LAB — RHEUMATOID FACTOR

## 2014-06-25 LAB — SEDIMENTATION RATE: Sed Rate: 78 mm/hr — ABNORMAL HIGH (ref 0–20)

## 2014-07-01 ENCOUNTER — Ambulatory Visit (HOSPITAL_COMMUNITY)
Admission: RE | Admit: 2014-07-01 | Discharge: 2014-07-01 | Disposition: A | Discharge status: Home / Self Care | Payer: Medicare Other | Source: Ambulatory Visit | Attending: Hematology | Admitting: Hematology

## 2014-07-03 ENCOUNTER — Ambulatory Visit (HOSPITAL_COMMUNITY)
Admission: RE | Admit: 2014-07-03 | Discharge: 2014-07-03 | Disposition: A | Discharge status: Home / Self Care | Payer: Medicare Other | Source: Ambulatory Visit | Attending: Hematology | Admitting: Hematology

## 2014-07-03 DIAGNOSIS — D649 Anemia, unspecified: Secondary | ICD-10-CM | POA: Diagnosis present

## 2014-07-04 ENCOUNTER — Ambulatory Visit (HOSPITAL_BASED_OUTPATIENT_CLINIC_OR_DEPARTMENT_OTHER): Payer: Medicare Other | Admitting: Hematology

## 2014-07-04 ENCOUNTER — Telehealth: Payer: Self-pay | Admitting: *Deleted

## 2014-07-04 ENCOUNTER — Telehealth: Payer: Self-pay | Admitting: Hematology

## 2014-07-04 ENCOUNTER — Encounter: Payer: Self-pay | Admitting: Hematology

## 2014-07-04 VITALS — BP 155/70 | HR 98 | Temp 98.4°F | Resp 18

## 2014-07-04 DIAGNOSIS — D649 Anemia, unspecified: Secondary | ICD-10-CM

## 2014-07-04 DIAGNOSIS — D631 Anemia in chronic kidney disease: Secondary | ICD-10-CM

## 2014-07-04 DIAGNOSIS — N184 Chronic kidney disease, stage 4 (severe): Secondary | ICD-10-CM

## 2014-07-04 DIAGNOSIS — N189 Chronic kidney disease, unspecified: Principal | ICD-10-CM

## 2014-07-04 DIAGNOSIS — C61 Malignant neoplasm of prostate: Secondary | ICD-10-CM | POA: Diagnosis not present

## 2014-07-04 NOTE — Progress Notes (Signed)
[name]  Telephone:(336) [phone] Fax:(336) [place] consult Note   Patient Care Team: [name], MD as PCP - General [name], MD as Consulting Physician (Urology) [name], MD as Consulting Physician (Hematology) 07/04/2014  CHIEF COMPLAINTS:  Follow up anemia   HISTORY OF PRESENTING ILLNESS:  [name] 81 y.o. male with past medical history of HTN, recently diagnosed to CHF with EF of 20-25%, prostate cancer, currently on treatment for elevated PSA, is here because of anemia   He was found to have abnormal CBC from at least 2010, with Hb around 11, over the past 5 years, his Hb has been 6-11, he had bilateral knee surgery and a left shoulder surgery, and required blood transfusion once.  He denied any bleeding episodes including hematochezia, melana, hemoptysis, hematuria or epitaxis. No mucosal bleeding or easy bruising. His last colonoscopy was more than 5 years, which was normal per pt.   He has developed bilateral lower extremity edema in the past few months, and a new onset right arm edema. He has mild to moderate dyspnea on exertion. He was evaluated by cardiologist last week, and echocardiogram from yesterday showed EF 25-30%. He lives alone, was still able to handle his daily living, but was not able to get out of a chair this morning without assistance.  He denies recent chest pain on exertion, shortness of breath on minimal exertion, pre-syncopal episodes, or palpitations. The patient was prescribed oral iron supplements and he takes 1 pill daily for the past 4 years, but did not help much.   INTERIM HISTORY: [name] returns for follow-up. He feels about the same as 1 months ago, no new complaints. He still has moderate fatigue and mild dyspnea on exertion, no chest pain or orthopnea. He is able to function well at home.  MEDICAL HISTORY:  Past Medical History  Diagnosis Date  . Hypertension   . Cancer 06-19-12    Hx. 15 yrs  ago -seed implant-PSA remains elevated somewhat.  . Arthritis     Osteoarthritis(right shoulder)-knees, hip . s/p LTKA  . Transfusion history 06-19-12    history s/p '11- LTKA  . Gout   . Dysrhythmia 06-19-12    medical record note shows hx R)BBB-. A.Fib/ flutter-not present  . Frequency of urination   . Anemia   . Swelling     10-03-13 both legs swells, right is greater  . Atrial flutter 02/10/2010    Qualifier: Diagnosis of  By: [name], MD, [name]     SURGICAL HISTORY: Past Surgical History  Procedure Laterality Date  . Joint replacement  06-19-12    '11 -LTKA  . Cataract extraction, bilateral    . Minor hemorrhoidectomy    . Radioactive seed implant  06-19-12    many yrs ago > 15 yrs  . Total hip arthroplasty Left 06/29/2012    Procedure: LEFT TOTAL HIP ARTHROPLASTY ANTERIOR APPROACH;  Surgeon: Mcarthur Rossetti, MD;  Location: [facility];  Service: Orthopedics;  Laterality: Left;  Left Total Hip Arthroplasty, Anterior Approach  . Total knee arthroplasty Right 10/10/2013    Procedure: RIGHT TOTAL KNEE ARTHROPLASTY;  Surgeon: Mcarthur Rossetti, MD;  Location: [facility];  Service: Orthopedics;  Laterality: Right;    SOCIAL HISTORY: History   Social History  . Marital Status: Widowed    Spouse Name: N/A  . Number of Children: One daughter lives in [place]  . Years of Education: N/A   Occupational History  .  He is retired from post service    Social History Main Topics  . Smoking status: Former Smoker    Types: Cigarettes    Quit date: 06/19/1980  . Smokeless tobacco: Not on file  . Alcohol Use: 1.8 oz/week    3 Shots of liquor per week     Comment: 10 -12 ounces daily  . Drug Use: No  . Sexual Activity: Not Currently   Other Topics Concern  . Not on file   Social History Narrative    FAMILY HISTORY: Family History  Problem Relation Age of Onset  . Cancer Brother 55    colon cancer   . Cancer Brother     colon cancer ?   Marland Kitchen Cancer Brother     colon  cancer     ALLERGIES:  is allergic to aspirin.  MEDICATIONS:  Current Outpatient Prescriptions  Medication Sig Dispense Refill  . acetaminophen (TYLENOL) 500 MG tablet Take 500 mg by mouth every 6 (six) hours as needed for moderate pain.    Marland Kitchen acetaminophen-codeine (TYLENOL #3) 300-30 MG per tablet   0  . allopurinol (ZYLOPRIM) 100 MG tablet Take 100 mg by mouth every evening.     . ferrous sulfate 325 (65 FE) MG tablet Take 325 mg by mouth daily with breakfast.    . furosemide (LASIX) 80 MG tablet 80 mg 2 (two) times daily.     . Multiple Vitamin (MULTIVITAMIN WITH MINERALS) TABS Take 1 tablet by mouth every morning.    . Omega-3 Fatty Acids (FISH OIL) 1200 MG CAPS Take 1,200 mg by mouth daily.    . pravastatin (PRAVACHOL) 20 MG tablet Take 20 mg by mouth daily.     . Tamsulosin HCl (FLOMAX) 0.4 MG CAPS Take 0.4 mg by mouth at bedtime.    . traMADol (ULTRAM) 50 MG tablet Take 50 mg by mouth as needed.     . enzalutamide (XTANDI) 40 MG capsule Take 160 mg by mouth daily. Take 4 tablets at one time daily.     No current facility-administered medications for this visit.   Facility-Administered Medications Ordered in Other Visits  Medication Dose Route Frequency Provider Last Rate Last Dose  . lactated ringers infusion    Continuous PRN Sherry Ruffing, CRNA 0 mL/hr at 08/16/13 0730      REVIEW OF SYSTEMS:   Constitutional: Denies fevers, chills or abnormal night sweats Eyes: Denies blurriness of vision, double vision or watery eyes Ears, nose, mouth, throat, and face: Denies mucositis or sore throat Respiratory: Denies cough, dyspnea or wheezes Cardiovascular: Denies palpitation, chest discomfort or lower extremity swelling Gastrointestinal:  Denies nausea, heartburn or change in bowel habits Skin: Denies abnormal skin rashes Lymphatics: Denies new lymphadenopathy or easy bruising Neurological:Denies numbness, tingling or new weaknesses Behavioral/Psych: Mood is stable, no new  changes  All other systems were reviewed with the patient and are negative.  PHYSICAL EXAMINATION: ECOG PERFORMANCE STATUS: 3 - Symptomatic, >50% confined to bed  Filed Vitals:   07/04/14 0853  BP: 155/70  Pulse: 98  Temp: 98.4 F (36.9 C)  Resp: 18   GENERAL:alert, no distress and comfortable SKIN: skin color, texture, turgor are normal, no rashes or significant lesions EYES: normal, conjunctiva are pink and non-injected, sclera clear OROPHARYNX:no exudate, no erythema and lips, buccal mucosa, and tongue normal  NECK: supple, thyroid normal size, non-tender, without nodularity LYMPH:  no palpable lymphadenopathy in the cervical, axillary or inguinal LUNGS: clear to auscultation and percussion with normal breathing  effort HEART: regular rate & rhythm and no murmurs and no lower extremity edema ABDOMEN:abdomen soft, non-tender and normal bowel sounds Musculoskeletal:no cyanosis of digits and no clubbing  PSYCH: alert & oriented x 3 with fluent speech NEURO: no focal motor/sensory deficits  LABORATORY DATA:  I have reviewed the data as listed CBC Latest Ref Rng 06/20/2014 10/13/2013 10/12/2013  WBC 4.0 - 10.3 10e3/uL 7.3 11.7(H) 10.7(H)  Hemoglobin 13.0 - 17.1 g/dL 8.3(L) 9.1(L) 8.8(L)  Hematocrit 38.4 - 49.9 % 25.2(L) 27.0(L) 26.2(L)  Platelets 140 - 400 10e3/uL 207 209 200    CMP Latest Ref Rng 06/20/2014 10/14/2013 10/13/2013  Glucose 70 - 140 mg/dl 106 105(H) 131(H)  BUN 7.0 - 26.0 mg/dL 61.5(H) 69(H) 65(H)  Creatinine 0.7 - 1.3 mg/dL 3.2(HH) 2.50(H) 2.48(H)  Sodium 136 - 145 mEq/L 137 138 137  Potassium 3.5 - 5.1 mEq/L 4.6 4.3 4.5  Chloride 96 - 112 mEq/L - 99 98  CO2 22 - 29 mEq/L 18(L) 26 25  Calcium 8.4 - 10.4 mg/dL 10.3 9.7 9.8  Total Protein 6.4 - 8.3 g/dL 7.1 - -  Total Bilirubin 0.20 - 1.20 mg/dL 0.38 - -  Alkaline Phos 40 - 150 U/L 120 - -  AST 5 - 34 U/L 25 - -  ALT 0 - 55 U/L 14 - -     RADIOGRAPHIC STUDIES: I have personally reviewed the radiological  images as listed and agreed with the findings in the report.  US Abdomen Complete 07/03/2014    IMPRESSION: 1. Increased hepatic echotexture is consistent with fatty infiltration. There is a stable simple appearing cystic structure in the right correction left hepatic lobe. A stable septated cystic structure is present in the left lobe is well. No cirrhotic changes are demonstrated. 2. There is sludge within the gallbladder without evidence of acute cholecystitis. 3. Stable increased renal cortical echotexture consistent with medical renal disease. There is no hydronephrosis. 4. The spleen is normal in size.   Electronically Signed   By: David  Martinique   On: 07/03/2014 09:13   ECHO 06/19/2014 Impressions:  - Severe global reduction in LV function; grade 1 diastolic dysfunction; mild LAE; moderately reduced RV function; mild TR with severely elevated pulmonary pressure.  ASSESSMENT & PLAN:  81 year old African-American male, with multiple comorbidities, present to my clinic to evaluate his anemia.  1. Normocytic hypo-productive anemia, likely anemia of iron deficiency and chronic disease secondary to CKD -His hemoglobin is 8.3, with MCV 94, absolute reticular count 36, consistent with normocytic hypo-productive anemia. -Ferritin 694, iron 29, saturation 11%. His high ferritin level is possibly related to his multiple medical issues especially a prostate cancer. Low saturation is supported for iron deficiency, especially in the setting of stage 4 CKD. I recommend IV Feraheme, he has tried oral iron which did not help much. -No lab evidence of hemolysis. Negative ANA, RA, hepatitis B, C., Normal folic acid and Y10 level. SPEP/UPEP were negative. His erythropoietin level was normal. His testosterone level is low secondary to his prostate cancer treatment, which also contributes to his anemia. -Alcohol cessation were discussed with patient, his willing to stop. -I discussed bone marrow biopsy to ruled  out pulmonary disease or bone metastasis from prostate cancer. His recent bone scan was negative. Patient is has stated to have a biopsy -I discussed the option of EPO injection. The side effects of thrombosis, especially heart attack and stroke, and potential tumor gross of his prostate cancer would discussed in great details. He declined for now. -  I also discussed the blood transfusion if his hemoglobin drops below 8. He had allergic reaction to blood transfusion in the past, does not want blood transfusion for now.  2. CKD stage 4 -His creatinine is worse than 8 months ago, possible related to his recent diuretics. -He will follow-up with his primary care physician and his cardiologist.  3. Prostate cancer initial T1cM diagnosed in March 2004, Gleason score 6, biochemical recurrence in September 2009, on Lupron and [place]  - he recently had a bone scan which was negative for disease. -He is on Lupron and and and enzalutamide, managed by Dr. Karsten Ro.  -He run out [place]  answer flutamide, he will contact Dr. Karsten Ro  4. CHF with EF 25-30% -Follow up with his cardiologist.  -Anemia may contributes to his heart failure, he needs treatment for his anemia.  5. Bilateral lower extremity and right upper extremity edema -I obtained an ultrasound Doppler today which was negative for DVT. -He is on diuretics.  Follow-up:  -IV Feraheme twice in the next two weeks  -RTC in 3 weeks    All questions were answered. The patient knows to call the clinic with any problems, questions or concerns. I spent 25 minutes counseling the patient face to face. The total time spent in the appointment was 30 minutes and more than 50% was on counseling.     [name], MD 07/04/2014 9:12 AM

## 2014-07-04 NOTE — Telephone Encounter (Signed)
Gave avs & calendar for March. Sent message to schedule IV.

## 2014-07-04 NOTE — Telephone Encounter (Signed)
Per staff message and POF I have scheduled appts. Advised scheduler of appts. JMW  

## 2014-07-05 ENCOUNTER — Other Ambulatory Visit: Payer: Self-pay | Admitting: Hematology

## 2014-07-08 ENCOUNTER — Telehealth: Payer: Self-pay | Admitting: Hematology

## 2014-07-08 LAB — UIFE/LIGHT CHAINS/TP QN, 24-HR UR
ALBUMIN, U: DETECTED
Alpha 1, Urine: DETECTED — AB
Alpha 2, Urine: DETECTED — AB
BETA UR: DETECTED — AB
GAMMA UR: DETECTED — AB
TIME-UPE24: 24 h
Total Protein, Urine-Ur/day: 255 mg/d — ABNORMAL HIGH (ref <150)
Total Protein, Urine: 15 mg/dL (ref 5–25)
Volume, Urine: 1700 mL

## 2014-07-08 LAB — 24 HR URINE,KAPPA/LAMBDA LIGHT CHAINS
24H Urine Volume: 1700 mL/24 h
Measured Kappa Chain: 0.85 mg/dL (ref <2.00)
Measured Lambda Chain: 0.39 mg/dL (ref <2.00)
TOTAL KAPPA CHAIN: 14.45 mg/(24.h)
Total Lambda Chain: 6.63 mg/24 h

## 2014-07-08 NOTE — Telephone Encounter (Signed)
Left message to confirm appointment for March. °

## 2014-07-18 ENCOUNTER — Other Ambulatory Visit: Payer: Self-pay | Admitting: Hematology

## 2014-07-18 ENCOUNTER — Ambulatory Visit (HOSPITAL_BASED_OUTPATIENT_CLINIC_OR_DEPARTMENT_OTHER): Payer: Medicare Other

## 2014-07-18 DIAGNOSIS — D631 Anemia in chronic kidney disease: Secondary | ICD-10-CM

## 2014-07-18 DIAGNOSIS — D649 Anemia, unspecified: Secondary | ICD-10-CM | POA: Diagnosis not present

## 2014-07-18 DIAGNOSIS — N184 Chronic kidney disease, stage 4 (severe): Secondary | ICD-10-CM

## 2014-07-18 DIAGNOSIS — N189 Chronic kidney disease, unspecified: Principal | ICD-10-CM

## 2014-07-18 MED ORDER — SODIUM CHLORIDE 0.9 % IV SOLN
Freq: Once | INTRAVENOUS | Status: AC
  Administered 2014-07-18: 09:00:00 via INTRAVENOUS

## 2014-07-18 MED ORDER — FERUMOXYTOL INJECTION 510 MG/17 ML
510.0000 mg | Freq: Once | INTRAVENOUS | Status: AC
  Administered 2014-07-18: 510 mg via INTRAVENOUS
  Filled 2014-07-18: qty 17

## 2014-07-18 NOTE — Patient Instructions (Signed)

## 2014-07-24 ENCOUNTER — Other Ambulatory Visit (INDEPENDENT_AMBULATORY_CARE_PROVIDER_SITE_OTHER): Payer: Medicare Other | Admitting: *Deleted

## 2014-07-24 DIAGNOSIS — N289 Disorder of kidney and ureter, unspecified: Secondary | ICD-10-CM

## 2014-07-24 LAB — BASIC METABOLIC PANEL
BUN: 65 mg/dL — AB (ref 6–23)
CALCIUM: 10.3 mg/dL (ref 8.4–10.5)
CO2: 30 mEq/L (ref 19–32)
CREATININE: 2.41 mg/dL — AB (ref 0.40–1.50)
Chloride: 95 mEq/L — ABNORMAL LOW (ref 96–112)
GFR: 32.82 mL/min — AB (ref >60.00)
GLUCOSE: 124 mg/dL — AB (ref 70–99)
Potassium: 3.8 mEq/L (ref 3.5–5.1)
Sodium: 133 mEq/L — ABNORMAL LOW (ref 135–145)

## 2014-07-25 ENCOUNTER — Telehealth: Payer: Self-pay | Admitting: Cardiovascular Disease

## 2014-07-25 ENCOUNTER — Telehealth: Payer: Self-pay | Admitting: Hematology

## 2014-07-25 ENCOUNTER — Ambulatory Visit (HOSPITAL_BASED_OUTPATIENT_CLINIC_OR_DEPARTMENT_OTHER): Payer: Medicare Other

## 2014-07-25 ENCOUNTER — Other Ambulatory Visit (HOSPITAL_BASED_OUTPATIENT_CLINIC_OR_DEPARTMENT_OTHER): Payer: Medicare Other

## 2014-07-25 ENCOUNTER — Ambulatory Visit (HOSPITAL_BASED_OUTPATIENT_CLINIC_OR_DEPARTMENT_OTHER): Payer: Medicare Other | Admitting: Hematology

## 2014-07-25 ENCOUNTER — Encounter: Payer: Self-pay | Admitting: Hematology

## 2014-07-25 VITALS — BP 151/84 | HR 89 | Temp 97.7°F | Resp 18 | Ht 65.0 in | Wt 181.0 lb

## 2014-07-25 DIAGNOSIS — D631 Anemia in chronic kidney disease: Secondary | ICD-10-CM

## 2014-07-25 DIAGNOSIS — R600 Localized edema: Secondary | ICD-10-CM

## 2014-07-25 DIAGNOSIS — D649 Anemia, unspecified: Secondary | ICD-10-CM | POA: Diagnosis not present

## 2014-07-25 DIAGNOSIS — C61 Malignant neoplasm of prostate: Secondary | ICD-10-CM | POA: Diagnosis not present

## 2014-07-25 DIAGNOSIS — I509 Heart failure, unspecified: Secondary | ICD-10-CM

## 2014-07-25 DIAGNOSIS — N189 Chronic kidney disease, unspecified: Principal | ICD-10-CM

## 2014-07-25 LAB — CBC & DIFF AND RETIC
BASO%: 0.5 % (ref 0.0–2.0)
Basophils Absolute: 0 10*3/uL (ref 0.0–0.1)
EOS%: 2 % (ref 0.0–7.0)
Eosinophils Absolute: 0.1 10*3/uL (ref 0.0–0.5)
HEMATOCRIT: 26.3 % — AB (ref 38.4–49.9)
HGB: 8.7 g/dL — ABNORMAL LOW (ref 13.0–17.1)
Immature Retic Fract: 1.3 % — ABNORMAL LOW (ref 3.00–10.60)
LYMPH%: 32.7 % (ref 14.0–49.0)
MCH: 30.6 pg (ref 27.2–33.4)
MCHC: 33.1 g/dL (ref 32.0–36.0)
MCV: 92.6 fL (ref 79.3–98.0)
MONO#: 0.6 10*3/uL (ref 0.1–0.9)
MONO%: 9.7 % (ref 0.0–14.0)
NEUT#: 3.5 10*3/uL (ref 1.5–6.5)
NEUT%: 55.1 % (ref 39.0–75.0)
PLATELETS: 219 10*3/uL (ref 140–400)
RBC: 2.84 10*6/uL — ABNORMAL LOW (ref 4.20–5.82)
RDW: 14 % (ref 11.0–14.6)
Retic %: 1.19 % (ref 0.80–1.80)
Retic Ct Abs: 33.8 10*3/uL — ABNORMAL LOW (ref 34.80–93.90)
WBC: 6.4 10*3/uL (ref 4.0–10.3)
lymph#: 2.1 10*3/uL (ref 0.9–3.3)
nRBC: 0 % (ref 0–0)

## 2014-07-25 LAB — HOLD TUBE, BLOOD BANK

## 2014-07-25 MED ORDER — SODIUM CHLORIDE 0.9 % IV SOLN
Freq: Once | INTRAVENOUS | Status: AC
  Administered 2014-07-25: 12:00:00 via INTRAVENOUS

## 2014-07-25 MED ORDER — SODIUM CHLORIDE 0.9 % IV SOLN
510.0000 mg | Freq: Once | INTRAVENOUS | Status: AC
  Administered 2014-07-25: 510 mg via INTRAVENOUS
  Filled 2014-07-25: qty 17

## 2014-07-25 NOTE — Progress Notes (Signed)
[place]  [phone] [phone]) [place] consult Note   Patient Care Team: [name], MD as PCP - General Kathie Rhodes, MD as Consulting Physician (Urology) [name], MD as Consulting Physician (Hematology) 07/25/2014  CHIEF COMPLAINTS:  Follow up anemia of chronic disease  HISTORY OF PRESENTING ILLNESS:  [facility] 81 y.o. male with past medical history of HTN, recently diagnosed to CHF with EF of 20-25%, prostate cancer, currently on treatment for elevated PSA, is here because of anemia   He was found to have abnormal CBC from at least 2010, with Hb around 11, over the past 5 years, his Hb has been 6-11, he had bilateral knee surgery and a left shoulder surgery, and required blood transfusion once.  He denied any bleeding episodes including hematochezia, melana, hemoptysis, hematuria or epitaxis. No mucosal bleeding or easy bruising. His last colonoscopy was more than 5 years, which was normal per pt.   He has developed bilateral lower extremity edema in the past few months, and a new onset right arm edema. He has mild to moderate dyspnea on exertion. He was evaluated by cardiologist last week, and echocardiogram from yesterday showed EF 25-30%. He lives alone, was still able to handle his daily living, but was not able to get out of a chair this morning without assistance.  He denies recent chest pain on exertion, shortness of breath on minimal exertion, pre-syncopal episodes, or palpitations. The patient was prescribed oral iron supplements and he takes 1 pill daily for the past 4 years, but did not help much.   INTERIM HISTORY: Mr. Murakami returns for follow-up. He received IV Feraheme one week ago, and did feel that a better afterwards. He has a little more energy and doesn't feel as cold as before. She otherwise denies other new symptoms, no chest pain, mild dyspnea on exertion is stable. He comes in for the second dose of ferriheme  today.   MEDICAL HISTORY:  Past Medical History  Diagnosis Date  . Hypertension   . Cancer 06-19-12    Hx. 15 yrs ago -seed implant-PSA remains elevated somewhat.  . Arthritis     Osteoarthritis(right shoulder)-knees, hip . s/p LTKA  . Transfusion history 06-19-12    history s/p '11- LTKA  . Gout   . Dysrhythmia 06-19-12    medical record note shows hx R)BBB-. A.Fib/ flutter-not present  . Frequency of urination   . Anemia   . Swelling     10-03-13 both legs swells, right is greater  . Atrial flutter 02/10/2010    Qualifier: Diagnosis of  By: [name], MD, [name]     SURGICAL HISTORY: Past Surgical History  Procedure Laterality Date  . Joint replacement  06-19-12    '11 -LTKA  . Cataract extraction, bilateral    . Minor hemorrhoidectomy    . Radioactive seed implant  06-19-12    many yrs ago > 15 yrs  . Total hip arthroplasty Left 06/29/2012    Procedure: LEFT TOTAL HIP ARTHROPLASTY ANTERIOR APPROACH;  Surgeon: Mcarthur Rossetti, MD;  Location: [facility];  Service: Orthopedics;  Laterality: Left;  Left Total Hip Arthroplasty, Anterior Approach  . Total knee arthroplasty Right 10/10/2013    Procedure: RIGHT TOTAL KNEE ARTHROPLASTY;  Surgeon: Mcarthur Rossetti, MD;  Location: [facility];  Service: Orthopedics;  Laterality: Right;    SOCIAL HISTORY: History   Social History  . Marital Status: Widowed    Spouse Name: N/A  . Number  of Children: One daughter lives in [place]  . Years of Education: N/A   Occupational History  . He is retired from post service    Social History Main Topics  . Smoking status: Former Smoker    Types: Cigarettes    Quit date: 06/19/1980  . Smokeless tobacco: Not on file  . Alcohol Use: 1.8 oz/week    3 Shots of liquor per week     Comment: 10 -12 ounces daily  . Drug Use: No  . Sexual Activity: Not Currently   Other Topics Concern  . Not on file   Social History Narrative    FAMILY HISTORY: Family History  Problem Relation  Age of Onset  . Cancer Brother 29    colon cancer   . Cancer Brother     colon cancer ?   Marland Kitchen Cancer Brother     colon cancer     ALLERGIES:  is allergic to aspirin.  MEDICATIONS:  Current Outpatient Prescriptions  Medication Sig Dispense Refill  . acetaminophen (TYLENOL) 500 MG tablet Take 500 mg by mouth every 6 (six) hours as needed for moderate pain.    Marland Kitchen acetaminophen-codeine (TYLENOL #3) 300-30 MG per tablet   0  . allopurinol (ZYLOPRIM) 100 MG tablet Take 100 mg by mouth every evening.     . enzalutamide (XTANDI) 40 MG capsule Take 160 mg by mouth daily. Take 4 tablets at one time daily.    . ferrous sulfate 325 (65 FE) MG tablet Take 325 mg by mouth daily with breakfast.    . furosemide (LASIX) 80 MG tablet 80 mg 2 (two) times daily.     . Multiple Vitamin (MULTIVITAMIN WITH MINERALS) TABS Take 1 tablet by mouth every morning.    . Omega-3 Fatty Acids (FISH OIL) 1200 MG CAPS Take 1,200 mg by mouth daily.    . pravastatin (PRAVACHOL) 20 MG tablet Take 20 mg by mouth daily.     . Tamsulosin HCl (FLOMAX) 0.4 MG CAPS Take 0.4 mg by mouth at bedtime.    . traMADol (ULTRAM) 50 MG tablet Take 50 mg by mouth as needed.      No current facility-administered medications for this visit.   Facility-Administered Medications Ordered in Other Visits  Medication Dose Route Frequency Provider Last Rate Last Dose  . lactated ringers infusion    Continuous PRN Janine Ores, CRNA 0 mL/hr at 08/16/13 0730      REVIEW OF SYSTEMS:   Constitutional: Denies fevers, chills or abnormal night sweats Eyes: Denies blurriness of vision, double vision or watery eyes Ears, nose, mouth, throat, and face: Denies mucositis or sore throat Respiratory: Denies cough, dyspnea or wheezes Cardiovascular: Denies palpitation, chest discomfort or lower extremity swelling Gastrointestinal:  Denies nausea, heartburn or change in bowel habits Skin: Denies abnormal skin rashes Lymphatics: Denies new  lymphadenopathy or easy bruising Neurological:Denies numbness, tingling or new weaknesses Behavioral/Psych: Mood is stable, no new changes  All other systems were reviewed with the patient and are negative.  PHYSICAL EXAMINATION: ECOG PERFORMANCE STATUS: 3 - Symptomatic, >50% confined to bed  Filed Vitals:   07/25/14 1046  BP: 151/84  Pulse: 89  Temp: 97.7 F (36.5 C)  Resp: 18   GENERAL:alert, no distress and comfortable SKIN: skin color, texture, turgor are normal, no rashes or significant lesions EYES: normal, conjunctiva are pink and non-injected, sclera clear OROPHARYNX:no exudate, no erythema and lips, buccal mucosa, and tongue normal  NECK: supple, thyroid normal size, non-tender, without nodularity LYMPH:  no palpable lymphadenopathy in the cervical, axillary or inguinal LUNGS: clear to auscultation and percussion with normal breathing effort HEART: regular rate & rhythm and no murmurs and no lower extremity edema ABDOMEN:abdomen soft, non-tender and normal bowel sounds Musculoskeletal:no cyanosis of digits and no clubbing  PSYCH: alert & oriented x 3 with fluent speech NEURO: no focal motor/sensory deficits  LABORATORY DATA:  I have reviewed the data as listed CBC Latest Ref Rng 07/25/2014 06/20/2014 10/13/2013  WBC 4.0 - 10.3 10e3/uL 6.4 7.3 11.7(H)  Hemoglobin 13.0 - 17.1 g/dL 8.7(L) 8.3(L) 9.1(L)  Hematocrit 38.4 - 49.9 % 26.3(L) 25.2(L) 27.0(L)  Platelets 140 - 400 10e3/uL 219 207 209    CMP Latest Ref Rng 07/24/2014 06/20/2014 10/14/2013  Glucose 70 - 99 mg/dL 124(H) 106 105(H)  BUN 6 - 23 mg/dL 65(H) 61.5(H) 69(H)  Creatinine 0.40 - 1.50 mg/dL 2.41(H) 3.2(HH) 2.50(H)  Sodium 135 - 145 mEq/L 133(L) 137 138  Potassium 3.5 - 5.1 mEq/L 3.8 4.6 4.3  Chloride 96 - 112 mEq/L 95(L) - 99  CO2 19 - 32 mEq/L 30 18(L) 26  Calcium 8.4 - 10.5 mg/dL 10.3 10.3 9.7  Total Protein 6.4 - 8.3 g/dL - 7.1 -  Total Bilirubin 0.20 - 1.20 mg/dL - 0.38 -  Alkaline Phos 40 - 150 U/L -  120 -  AST 5 - 34 U/L - 25 -  ALT 0 - 55 U/L - 14 -     RADIOGRAPHIC STUDIES: I have personally reviewed the radiological images as listed and agreed with the findings in the report.  US Abdomen Complete 07/03/2014    IMPRESSION: 1. Increased hepatic echotexture is consistent with fatty infiltration. There is a stable simple appearing cystic structure in the right correction left hepatic lobe. A stable septated cystic structure is present in the left lobe is well. No cirrhotic changes are demonstrated. 2. There is sludge within the gallbladder without evidence of acute cholecystitis. 3. Stable increased renal cortical echotexture consistent with medical renal disease. There is no hydronephrosis. 4. The spleen is normal in size.   Electronically Signed   By: David  Martinique   On: 07/03/2014 09:13   ECHO 06/19/2014 Impressions:  - Severe global reduction in LV function; grade 1 diastolic dysfunction; mild LAE; moderately reduced RV function; mild TR with severely elevated pulmonary pressure.  ASSESSMENT & PLAN:  81 year old African-American male, with multiple comorbidities, present to my clinic to evaluate his anemia.  1. Normocytic hypo-productive anemia, likely anemia of iron deficiency and chronic disease secondary to CKD -His hemoglobin is 8.3, with MCV 94, absolute reticular count 36, consistent with normocytic hypo-productive anemia. -Ferritin 694, iron 29, saturation 11%. His high ferritin level is possibly related to his multiple medical issues especially a prostate cancer. Low saturation is supported for iron deficiency, especially in the setting of stage 4 CKD. I recommend IV Feraheme, he has tried oral iron which did not help much. -No lab evidence of hemolysis. Negative ANA, RA, hepatitis B, C., Normal folic acid and B93 level. SPEP/UPEP were negative. His erythropoietin level was normal. His testosterone level is low secondary to his prostate cancer treatment, which also  contributes to his anemia. -Alcohol cessation were discussed with patient, his willing to stop. -I discussed bone marrow biopsy to ruled out bone marrow disease or bone metastasis from prostate cancer. His recent bone scan was negative. Patient declined biopsy -I discussed the option of EPO injection. The side effects of thrombosis, especially heart attack and stroke, and potential tumor  growth of his prostate cancer were discussed in great details. He declined for now. -I also discussed the blood transfusion if his hemoglobin drops below 8. He had allergic reaction to blood transfusion in the past, does not want blood transfusion for now.  2. CKD stage 4 -Cr is slightly better today -Continue follow-up with his primary care physician   3. Prostate cancer initial T1cM diagnosed in March 2004, Gleason score 6, biochemical recurrence in September 2009, on Lupron and [place]  - he recently had a bone scan which was negative for disease. -He is on Lupron and and and enzalutamide, managed by Dr. Karsten Ro.  -He has restarted Xtandi  4. CHF with EF 25-30% -Follow up with his cardiologist.  -We discussed anemia may contributes to his heart failure  5. Bilateral lower extremity and right upper extremity edema -I obtained an ultrasound Doppler today which was negative for DVT. -He is on diuretics.  Follow-up:  -lab in 6 weeks and next visit -RTC in 3 month   All questions were answered. The patient knows to call the clinic with any problems, questions or concerns. I spent 25 minutes counseling the patient face to face. The total time spent in the appointment was 30 minutes and more than 50% was on counseling.     [name], MD 07/25/2014 11:09 AM

## 2014-07-25 NOTE — Telephone Encounter (Signed)
Left message to call back for lab results.

## 2014-07-25 NOTE — Telephone Encounter (Signed)
Gave avs & calendar for May & June °

## 2014-07-25 NOTE — Patient Instructions (Signed)

## 2014-07-25 NOTE — Telephone Encounter (Signed)
New problem    Pt's niece was told by Altha Harm per vm to call office, but she didn't say why. Please leave a message is no answer,

## 2014-07-28 NOTE — Telephone Encounter (Signed)
Results of BMEt from 07/24/14 reviewed, Pt has an app with Dr Joelyn Oms renal 4/13 and will address Creat/Bun then.

## 2014-07-28 NOTE — Telephone Encounter (Signed)
F/U      Pt returning call about lab results.      Please call back.

## 2014-08-22 ENCOUNTER — Ambulatory Visit: Payer: Medicare Other

## 2014-08-22 DIAGNOSIS — M79676 Pain in unspecified toe(s): Secondary | ICD-10-CM

## 2014-08-22 DIAGNOSIS — B351 Tinea unguium: Secondary | ICD-10-CM

## 2014-08-22 NOTE — Progress Notes (Signed)
   Subjective:    Patient ID: [name], male    DOB: 10-Sep-1923, 81 y.o.   MRN: [id number]  HPI I AM HERE TO SEE ABOUT THESE TOENAILS SOME OF THEM ARE COMING OFF AND THE RIGHT BIG TOENAIL IS DARK AND BRITTLE   [name], 81 year old male, since the office today for painful elongated toenails as well as discoloration of his nails.  He denies any redness or drainage from around the nail sites. Nails are painful with shoe gear. No other complaints at this time.    Review of Systems  Hematological: Bruises/bleeds easily.  All other systems reviewed and are negative.      Objective:   Physical Exam AAO x 3, NAD DP/PT pulses palpable b/l. CRT < 3sec. Mild chronic bilateral lower extremity edema. Protective sensation intact the Semmes Weinstein monofilament, vibratory sensation intact, Achilles tendon reflex intact. Nails hypertrophic, dystrophic, elongated, yellow-brown discoloration x9. Right hallux nail with evidence of starting to grow back. Left hallux nail is loose and underlying nailbed along the distal aspect and firmly appeared proximally. No surrounding erythema or drainage from the nail sites. No clinical signs of infection. No open skin lesions or pre-ulcer lesions. MMT 5/5, ROM WNL No calf pain, swelling, warmth.     Assessment & Plan:  81 year old male with symptomatic onychomycosis. -Various treatment options discussed including alternatives, risks, complications. -Nails were debrided x9 without complications to patient comfort. -Patient was asking about medications to help treat the nail fungus. Recommended over-the-counter therapy, fungi-nail. Directed on use and side effects. Her to stop immediately if any are to occur he wishes to proceed to treatment.  -Discussed the importance of daily foot inspection. -Followup in 3 months or sooner if any palms are to arise or any changes symptoms. In the meantime call any questions, concerns.

## 2014-09-05 ENCOUNTER — Other Ambulatory Visit (HOSPITAL_BASED_OUTPATIENT_CLINIC_OR_DEPARTMENT_OTHER): Payer: Medicare Other

## 2014-09-05 DIAGNOSIS — N189 Chronic kidney disease, unspecified: Secondary | ICD-10-CM

## 2014-09-05 DIAGNOSIS — D649 Anemia, unspecified: Secondary | ICD-10-CM

## 2014-09-05 DIAGNOSIS — C61 Malignant neoplasm of prostate: Secondary | ICD-10-CM | POA: Diagnosis not present

## 2014-09-05 DIAGNOSIS — D631 Anemia in chronic kidney disease: Secondary | ICD-10-CM

## 2014-09-05 LAB — CBC & DIFF AND RETIC
BASO%: 0.5 % (ref 0.0–2.0)
BASOS ABS: 0 10*3/uL (ref 0.0–0.1)
EOS%: 2.7 % (ref 0.0–7.0)
Eosinophils Absolute: 0.2 10*3/uL (ref 0.0–0.5)
HCT: 29 % — ABNORMAL LOW (ref 38.4–49.9)
HEMOGLOBIN: 10.1 g/dL — AB (ref 13.0–17.1)
Immature Retic Fract: 1.7 % — ABNORMAL LOW (ref 3.00–10.60)
LYMPH#: 2.9 10*3/uL (ref 0.9–3.3)
LYMPH%: 46.5 % (ref 14.0–49.0)
MCH: 31.2 pg (ref 27.2–33.4)
MCHC: 34.8 g/dL (ref 32.0–36.0)
MCV: 89.5 fL (ref 79.3–98.0)
MONO#: 0.5 10*3/uL (ref 0.1–0.9)
MONO%: 7.4 % (ref 0.0–14.0)
NEUT#: 2.7 10*3/uL (ref 1.5–6.5)
NEUT%: 42.9 % (ref 39.0–75.0)
Platelets: 169 10*3/uL (ref 140–400)
RBC: 3.24 10*6/uL — AB (ref 4.20–5.82)
RDW: 15 % — AB (ref 11.0–14.6)
RETIC CT ABS: 19.12 10*3/uL — AB (ref 34.80–93.90)
Retic %: 0.59 % — ABNORMAL LOW (ref 0.80–1.80)
WBC: 6.2 10*3/uL (ref 4.0–10.3)

## 2014-10-21 ENCOUNTER — Other Ambulatory Visit (HOSPITAL_BASED_OUTPATIENT_CLINIC_OR_DEPARTMENT_OTHER): Payer: Medicare Other

## 2014-10-21 ENCOUNTER — Ambulatory Visit (HOSPITAL_BASED_OUTPATIENT_CLINIC_OR_DEPARTMENT_OTHER): Payer: Medicare Other | Admitting: Hematology

## 2014-10-21 ENCOUNTER — Encounter: Payer: Self-pay | Admitting: Hematology

## 2014-10-21 ENCOUNTER — Telehealth: Payer: Self-pay | Admitting: Hematology and Oncology

## 2014-10-21 VITALS — BP 140/55 | HR 72 | Temp 98.4°F | Resp 18 | Ht 65.0 in | Wt 159.8 lb

## 2014-10-21 DIAGNOSIS — D649 Anemia, unspecified: Secondary | ICD-10-CM

## 2014-10-21 DIAGNOSIS — N184 Chronic kidney disease, stage 4 (severe): Secondary | ICD-10-CM

## 2014-10-21 DIAGNOSIS — C61 Malignant neoplasm of prostate: Secondary | ICD-10-CM

## 2014-10-21 DIAGNOSIS — D631 Anemia in chronic kidney disease: Secondary | ICD-10-CM | POA: Diagnosis not present

## 2014-10-21 DIAGNOSIS — I509 Heart failure, unspecified: Secondary | ICD-10-CM

## 2014-10-21 DIAGNOSIS — N189 Chronic kidney disease, unspecified: Secondary | ICD-10-CM

## 2014-10-21 LAB — CBC & DIFF AND RETIC
BASO%: 0.2 % (ref 0.0–2.0)
Basophils Absolute: 0 10*3/uL (ref 0.0–0.1)
EOS%: 1.1 % (ref 0.0–7.0)
Eosinophils Absolute: 0.1 10*3/uL (ref 0.0–0.5)
HCT: 21.2 % — ABNORMAL LOW (ref 38.4–49.9)
HGB: 7.3 g/dL — ABNORMAL LOW (ref 13.0–17.1)
Immature Retic Fract: 4.8 % (ref 3.00–10.60)
LYMPH%: 33.8 % (ref 14.0–49.0)
MCH: 31.5 pg (ref 27.2–33.4)
MCHC: 34.4 g/dL (ref 32.0–36.0)
MCV: 91.4 fL (ref 79.3–98.0)
MONO#: 0.7 10*3/uL (ref 0.1–0.9)
MONO%: 13.1 % (ref 0.0–14.0)
NEUT#: 2.7 10*3/uL (ref 1.5–6.5)
NEUT%: 51.8 % (ref 39.0–75.0)
Platelets: 181 10*3/uL (ref 140–400)
RBC: 2.32 10*6/uL — ABNORMAL LOW (ref 4.20–5.82)
RDW: 15.8 % — ABNORMAL HIGH (ref 11.0–14.6)
Retic %: 2.01 % — ABNORMAL HIGH (ref 0.80–1.80)
Retic Ct Abs: 46.63 10*3/uL (ref 34.80–93.90)
WBC: 5.3 10*3/uL (ref 4.0–10.3)
lymph#: 1.8 10*3/uL (ref 0.9–3.3)

## 2014-10-21 LAB — IRON AND TIBC CHCC
%SAT: 42 % (ref 20–55)
Iron: 116 ug/dL (ref 42–163)
TIBC: 279 ug/dL (ref 202–409)
UIBC: 163 ug/dL (ref 117–376)

## 2014-10-21 LAB — FERRITIN CHCC: Ferritin: 1848 ng/ml — ABNORMAL HIGH (ref 22–316)

## 2014-10-21 NOTE — Telephone Encounter (Signed)
Appointments made and avs printed for patient °

## 2014-10-21 NOTE — Progress Notes (Addendum)
[facility]  [phone] Fax:([phone]) [place] consult Note   Patient Care Team: [name], MD as PCP - General Kathie Rhodes, MD as Consulting Physician (Urology) [name], MD as Consulting Physician (Hematology) 10/21/2014  CHIEF COMPLAINTS:  Follow up anemia of chronic disease  HISTORY OF PRESENTING ILLNESS:  [facility] 81 y.o. male with past medical history of HTN, recently diagnosed to CHF with EF of 20-25%, prostate cancer, currently on treatment for elevated PSA, is here because of anemia   He was found to have abnormal CBC from at least 2010, with Hb around 11, over the past 5 years, his Hb has been 6-11, he had bilateral knee surgery and a left shoulder surgery, and required blood transfusion once.  He denied any bleeding episodes including hematochezia, melana, hemoptysis, hematuria or epitaxis. No mucosal bleeding or easy bruising. His last colonoscopy was more than 5 years, which was normal per pt.   He has developed bilateral lower extremity edema in the past few months, and a new onset right arm edema. He has mild to moderate dyspnea on exertion. He was evaluated by cardiologist last week, and echocardiogram from yesterday showed EF 25-30%. He lives alone, was still able to handle his daily living, but was not able to get out of a chair this morning without assistance.  He denies recent chest pain on exertion, shortness of breath on minimal exertion, pre-syncopal episodes, or palpitations. The patient was prescribed oral iron supplements and he takes 1 pill daily for the past 4 years, but did not help much.   INTERIM HISTORY: Mr. Mo returns for follow-up. He has been on Lasix for his extremely swelling, and it worked very well. He lost 5-6 pounds in the past months after diuretics. He has low appetite, drinks ensure sometime. He complains about his right shoulder pain from rotator cuff, no significant chest pain or dyspnea on  exertion.    MEDICAL HISTORY:  Past Medical History  Diagnosis Date  . Hypertension   . Cancer 06-19-12    Hx. 15 yrs ago -seed implant-PSA remains elevated somewhat.  . Arthritis     Osteoarthritis(right shoulder)-knees, hip . s/p LTKA  . Transfusion history 06-19-12    history s/p '11- LTKA  . Gout   . Dysrhythmia 06-19-12    medical record note shows hx R)BBB-. A.Fib/ flutter-not present  . Frequency of urination   . Anemia   . Swelling     10-03-13 both legs swells, right is greater  . Atrial flutter 02/10/2010    Qualifier: Diagnosis of  By: [name], MD, [name]     SURGICAL HISTORY: Past Surgical History  Procedure Laterality Date  . Joint replacement  06-19-12    '11 -LTKA  . Cataract extraction, bilateral    . Minor hemorrhoidectomy    . Radioactive seed implant  06-19-12    many yrs ago > 15 yrs  . Total hip arthroplasty Left 06/29/2012    Procedure: LEFT TOTAL HIP ARTHROPLASTY ANTERIOR APPROACH;  Surgeon: Mcarthur Rossetti, MD;  Location: [facility];  Service: Orthopedics;  Laterality: Left;  Left Total Hip Arthroplasty, Anterior Approach  . Total knee arthroplasty Right 10/10/2013    Procedure: RIGHT TOTAL KNEE ARTHROPLASTY;  Surgeon: Mcarthur Rossetti, MD;  Location: [facility];  Service: Orthopedics;  Laterality: Right;    SOCIAL HISTORY: History   Social History  . Marital Status: Widowed    Spouse Name: N/A  . Number of Children:  One daughter lives in [place]  . Years of Education: N/A   Occupational History  . He is retired from post service    Social History Main Topics  . Smoking status: Former Smoker    Types: Cigarettes    Quit date: 06/19/1980  . Smokeless tobacco: Not on file  . Alcohol Use: 1.8 oz/week    3 Shots of liquor per week     Comment: 10 -12 ounces daily  . Drug Use: No  . Sexual Activity: Not Currently   Other Topics Concern  . Not on file   Social History Narrative    FAMILY HISTORY: Family History  Problem  Relation Age of Onset  . Cancer Brother 46    colon cancer   . Cancer Brother     colon cancer ?   Marland Kitchen Cancer Brother     colon cancer     ALLERGIES:  is allergic to aspirin.  MEDICATIONS:  Current Outpatient Prescriptions  Medication Sig Dispense Refill  . acetaminophen (TYLENOL) 500 MG tablet Take 500 mg by mouth every 6 (six) hours as needed for moderate pain.    Marland Kitchen acetaminophen-codeine (TYLENOL #3) 300-30 MG per tablet   0  . allopurinol (ZYLOPRIM) 100 MG tablet Take 100 mg by mouth every evening.     . enzalutamide (XTANDI) 40 MG capsule Take 160 mg by mouth daily. Take 4 tablets at one time daily.    . ferrous sulfate 325 (65 FE) MG tablet Take 325 mg by mouth daily with breakfast.    . Multiple Vitamin (MULTIVITAMIN WITH MINERALS) TABS Take 1 tablet by mouth every morning.    . Omega-3 Fatty Acids (FISH OIL) 1200 MG CAPS Take 1,200 mg by mouth daily.    . pravastatin (PRAVACHOL) 20 MG tablet Take 20 mg by mouth daily.     . Tamsulosin HCl (FLOMAX) 0.4 MG CAPS Take 0.4 mg by mouth at bedtime.    . traMADol (ULTRAM) 50 MG tablet Take 50 mg by mouth as needed.      No current facility-administered medications for this visit.   Facility-Administered Medications Ordered in Other Visits  Medication Dose Route Frequency Provider Last Rate Last Dose  . lactated ringers infusion    Continuous PRN Janine Ores, CRNA 0 mL/hr at 08/16/13 0730      REVIEW OF SYSTEMS:   Constitutional: Denies fevers, chills or abnormal night sweats Eyes: Denies blurriness of vision, double vision or watery eyes Ears, nose, mouth, throat, and face: Denies mucositis or sore throat Respiratory: Denies cough, dyspnea or wheezes Cardiovascular: Denies palpitation, chest discomfort or lower extremity swelling Gastrointestinal:  Denies nausea, heartburn or change in bowel habits Skin: Denies abnormal skin rashes Lymphatics: Denies new lymphadenopathy or easy bruising Neurological:Denies numbness,  tingling or new weaknesses Behavioral/Psych: Mood is stable, no new changes  All other systems were reviewed with the patient and are negative.  PHYSICAL EXAMINATION: ECOG PERFORMANCE STATUS: 3 - Symptomatic, >50% confined to bed  Filed Vitals:   10/21/14 1013  BP: 140/55  Pulse: 72  Temp: 98.4 F (36.9 C)  Resp: 18   GENERAL:alert, no distress and comfortable SKIN: skin color, texture, turgor are normal, no rashes or significant lesions EYES: normal, conjunctiva are pink and non-injected, sclera clear OROPHARYNX:no exudate, no erythema and lips, buccal mucosa, and tongue normal  NECK: supple, thyroid normal size, non-tender, without nodularity LYMPH:  no palpable lymphadenopathy in the cervical, axillary or inguinal LUNGS: clear to auscultation and percussion with normal breathing  effort HEART: regular rate & rhythm and no murmurs and no lower extremity edema ABDOMEN:abdomen soft, non-tender and normal bowel sounds Musculoskeletal:no cyanosis of digits and no clubbing  PSYCH: alert & oriented x 3 with fluent speech NEURO: no focal motor/sensory deficits  LABORATORY DATA:  I have reviewed the data as listed CBC Latest Ref Rng 10/21/2014 09/05/2014 07/25/2014  WBC 4.0 - 10.3 10e3/uL 5.3 6.2 6.4  Hemoglobin 13.0 - 17.1 g/dL 7.3(L) 10.1(L) 8.7(L)  Hematocrit 38.4 - 49.9 % 21.2(L) 29.0(L) 26.3(L)  Platelets 140 - 400 10e3/uL 181 169 219    CMP Latest Ref Rng 07/24/2014 06/20/2014 10/14/2013  Glucose 70 - 99 mg/dL 124(H) 106 105(H)  BUN 6 - 23 mg/dL 65(H) 61.5(H) 69(H)  Creatinine 0.40 - 1.50 mg/dL 2.41(H) 3.2(HH) 2.50(H)  Sodium 135 - 145 mEq/L 133(L) 137 138  Potassium 3.5 - 5.1 mEq/L 3.8 4.6 4.3  Chloride 96 - 112 mEq/L 95(L) - 99  CO2 19 - 32 mEq/L 30 18(L) 26  Calcium 8.4 - 10.5 mg/dL 10.3 10.3 9.7  Total Protein 6.4 - 8.3 g/dL - 7.1 -  Total Bilirubin 0.20 - 1.20 mg/dL - 0.38 -  Alkaline Phos 40 - 150 U/L - 120 -  AST 5 - 34 U/L - 25 -  ALT 0 - 55 U/L - 14 -      RADIOGRAPHIC STUDIES: I have personally reviewed the radiological images as listed and agreed with the findings in the report.  US Abdomen Complete 07/03/2014    IMPRESSION: 1. Increased hepatic echotexture is consistent with fatty infiltration. There is a stable simple appearing cystic structure in the right correction left hepatic lobe. A stable septated cystic structure is present in the left lobe is well. No cirrhotic changes are demonstrated. 2. There is sludge within the gallbladder without evidence of acute cholecystitis. 3. Stable increased renal cortical echotexture consistent with medical renal disease. There is no hydronephrosis. 4. The spleen is normal in size.   Electronically Signed   By: David  Martinique   On: 07/03/2014 09:13   ECHO 06/19/2014 Impressions:  - Severe global reduction in LV function; grade 1 diastolic dysfunction; mild LAE; moderately reduced RV function; mild TR with severely elevated pulmonary pressure.  ASSESSMENT & PLAN:  81 year old African-American male, with multiple comorbidities, present to my clinic to evaluate his anemia.  1. Normocytic hypo-productive anemia, likely anemia of iron deficiency and chronic disease secondary to CKD -His hemoglobin on initial presentation was 8.3, with MCV 94, absolute reticular count 36, consistent with normocytic hypo-productive anemia. -Ferritin 694, iron 29, saturation 11%. His high ferritin level is possibly related to his multiple medical issues especially a prostate cancer. Low saturation is supported for iron deficiency, especially in the setting of stage 4 CKD. I recommended IV Feraheme, he has tried oral iron which did not help much. -No lab evidence of hemolysis. Negative ANA, RA, hepatitis B, C., Normal folic acid and S85 level. SPEP/UPEP were negative. His erythropoietin level was normal. His testosterone level is low secondary to his prostate cancer treatment, which also contributes to his anemia. -Alcohol  cessation were discussed with patient, his willing to stop. -I discussed bone marrow biopsy to ruled out bone marrow disease or bone metastasis from prostate cancer. His recent bone scan was negative. Patient declined biopsy -I discussed the option of EPO injection. The side effects of thrombosis, especially heart attack and stroke, and potential tumor growth of his prostate cancer were discussed in great details. He declined last time.  -  His iron study results still pending today. If he has no serum iron or transferrin saturation, I'll consider repeat IV Feraheme. He did have moderate response to Feraheme  -If his iron studies normal, I strongly encouraged him to consider Aranesp injection, he and his wife like to discuss with his nephrologist later this week.  -I also discussed the blood transfusion if his hemoglobin drops below 8. He had allergic reaction to blood transfusion in the past, does not want blood transfusion for now.  2. CKD stage 4 -Continue follow-up with his nephrologist Dr. Joelyn Oms, he has appointment this week  3. Prostate cancer initial T1cM diagnosed in March 2004, Gleason score 6, biochemical recurrence in September 2009, on Lupron and [place]  - he recently had a bone scan which was negative for disease. -He is on Lupron and and and enzalutamide, managed by Dr. Karsten Ro.  -He has restarted Xtandi  4. CHF with EF 25-30% -Follow up with his cardiologist.  -We discussed anemia may contributes to his heart failure  5. Bilateral lower extremity and right upper extremity edema -I obtained an ultrasound Doppler today which was negative for DVT. -He is on diuretics.  Follow-up:  -If his iron studies show slow level, I'll consider repeat IV Feraheme -He will discuss Aranesp injection with his nephrologist this week -I will see him back in a month.   All questions were answered. The patient knows to call the clinic with any problems, questions or concerns. I spent 25 minutes  counseling the patient face to face. The total time spent in the appointment was 30 minutes and more than 50% was on counseling.     [name], MD 10/21/2014 10:30 AM   Addendum:  Results for DANTRELL, SCHERTZER (MRN [id number]) as of 11/02/2014 21:42  Ref. Range 10/21/2014 09:51  Iron Latest Ref Range: 42-163 ug/dL 116  UIBC Latest Ref Range: 117-376 ug/dL 163  TIBC Latest Ref Range: 202-409 ug/dL 279  %SAT Latest Ref Range: 20-55 % 42  Ferritin Latest Ref Range: 22-316 ng/ml 1,848 (H)   His iron level is adequate. I have spoken with his nephrologist Dr. Joelyn Oms, who agrees with Aranesp and we will start him on injection at his office.  [name]

## 2014-10-28 ENCOUNTER — Telehealth: Payer: Self-pay | Admitting: Hematology

## 2014-10-28 NOTE — Telephone Encounter (Signed)
Was notified that i had scheduled him with the wrong doctor,i have rescheduled the appointment and called the patient

## 2014-11-05 ENCOUNTER — Encounter (HOSPITAL_COMMUNITY)
Admission: RE | Admit: 2014-11-05 | Discharge: 2014-11-05 | Disposition: A | Discharge status: Home / Self Care | Payer: Medicare Other | Source: Ambulatory Visit | Attending: Nephrology | Admitting: Nephrology

## 2014-11-05 DIAGNOSIS — Z5181 Encounter for therapeutic drug level monitoring: Secondary | ICD-10-CM | POA: Diagnosis not present

## 2014-11-05 DIAGNOSIS — N185 Chronic kidney disease, stage 5: Secondary | ICD-10-CM | POA: Diagnosis not present

## 2014-11-05 DIAGNOSIS — D631 Anemia in chronic kidney disease: Secondary | ICD-10-CM | POA: Insufficient documentation

## 2014-11-05 DIAGNOSIS — Z79899 Other long term (current) drug therapy: Secondary | ICD-10-CM | POA: Diagnosis not present

## 2014-11-05 LAB — POCT HEMOGLOBIN-HEMACUE: HEMOGLOBIN: 7 g/dL — AB (ref 13.0–17.0)

## 2014-11-05 MED ORDER — DARBEPOETIN ALFA 100 MCG/0.5ML IJ SOSY
PREFILLED_SYRINGE | INTRAMUSCULAR | Status: AC
  Administered 2014-11-05: 100 ug via SUBCUTANEOUS
  Filled 2014-11-05: qty 0.5

## 2014-11-05 MED ORDER — DARBEPOETIN ALFA 100 MCG/0.5ML IJ SOSY
100.0000 ug | PREFILLED_SYRINGE | Freq: Once | INTRAMUSCULAR | Status: AC
  Administered 2014-11-05: 100 ug via SUBCUTANEOUS

## 2014-11-05 NOTE — Discharge Instructions (Signed)
Darbepoetin Alfa injection What is this medicine? DARBEPOETIN ALFA (dar be POE e tin AL fa) helps your body make more red blood cells. It is used to treat anemia caused by chronic kidney failure and chemotherapy. This medicine may be used for other purposes; ask your health care provider or pharmacist if you have questions. COMMON BRAND NAME(S): Aranesp What should I tell my health care provider before I take this medicine? They need to know if you have any of these conditions: -blood clotting disorders or history of blood clots -cancer patient not on chemotherapy -cystic fibrosis -heart disease, such as angina, heart failure, or a history of a heart attack -hemoglobin level of 12 g/dL or greater -high blood pressure -low levels of folate, iron, or vitamin B12 -seizures -an unusual or allergic reaction to darbepoetin, erythropoietin, albumin, hamster proteins, latex, other medicines, foods, dyes, or preservatives -pregnant or trying to get pregnant -breast-feeding How should I use this medicine? This medicine is for injection into a vein or under the skin. It is usually given by a health care professional in a hospital or clinic setting. If you get this medicine at home, you will be taught how to prepare and give this medicine. Do not shake the solution before you withdraw a dose. Use exactly as directed. Take your medicine at regular intervals. Do not take your medicine more often than directed. It is important that you put your used needles and syringes in a special sharps container. Do not put them in a trash can. If you do not have a sharps container, call your pharmacist or healthcare provider to get one. Talk to your pediatrician regarding the use of this medicine in children. While this medicine may be used in children as young as 1 year for selected conditions, precautions do apply. Overdosage: If you think you have taken too much of this medicine contact a poison control center or  emergency room at once. NOTE: This medicine is only for you. Do not share this medicine with others. What if I miss a dose? If you miss a dose, take it as soon as you can. If it is almost time for your next dose, take only that dose. Do not take double or extra doses. What may interact with this medicine? Do not take this medicine with any of the following medications: -epoetin alfa This list may not describe all possible interactions. Give your health care provider a list of all the medicines, herbs, non-prescription drugs, or dietary supplements you use. Also tell them if you smoke, drink alcohol, or use illegal drugs. Some items may interact with your medicine. What should I watch for while using this medicine? Visit your prescriber or health care professional for regular checks on your progress and for the needed blood tests and blood pressure measurements. It is especially important for the doctor to make sure your hemoglobin level is in the desired range, to limit the risk of potential side effects and to give you the best benefit. Keep all appointments for any recommended tests. Check your blood pressure as directed. Ask your doctor what your blood pressure should be and when you should contact him or her. As your body makes more red blood cells, you may need to take iron, folic acid, or vitamin B supplements. Ask your doctor or health care provider which products are right for you. If you have kidney disease continue dietary restrictions, even though this medication can make you feel better. Talk with your doctor or health   care professional about the foods you eat and the vitamins that you take. What side effects may I notice from receiving this medicine? Side effects that you should report to your doctor or health care professional as soon as possible: -allergic reactions like skin rash, itching or hives, swelling of the face, lips, or tongue -breathing problems -changes in vision -chest  pain -confusion, trouble speaking or understanding -feeling faint or lightheaded, falls -high blood pressure -muscle aches or pains -pain, swelling, warmth in the leg -rapid weight gain -severe headaches -sudden numbness or weakness of the face, arm or leg -trouble walking, dizziness, loss of balance or coordination -seizures (convulsions) -swelling of the ankles, feet, hands -unusually weak or tired Side effects that usually do not require medical attention (report to your doctor or health care professional if they continue or are bothersome): -diarrhea -fever, chills (flu-like symptoms) -headaches -nausea, vomiting -redness, stinging, or swelling at site where injected This list may not describe all possible side effects. Call your doctor for medical advice about side effects. You may report side effects to FDA at 1-800-FDA-1088. Where should I keep my medicine? Keep out of the reach of children. Store in a refrigerator between 2 and 8 degrees C (36 and 46 degrees F). Do not freeze. Do not shake. Throw away any unused portion if using a single-dose vial. Throw away any unused medicine after the expiration date. NOTE: This sheet is a summary. It may not cover all possible information. If you have questions about this medicine, talk to your doctor, pharmacist, or health care provider.  2015, [employer]. (2008-04-01 10:23:57)  

## 2014-11-17 ENCOUNTER — Telehealth: Payer: Self-pay | Admitting: *Deleted

## 2014-11-17 NOTE — Telephone Encounter (Signed)
Received call from [facility] @ [facility] re:  Pt will be discharged from hospital today.  Gwinda Passe wanted to know if Dr. Burr Medico will be the attending for hospice services.   Spoke with Olegario Shearer, new pt referral @ [facility] and informed her re:  Dr. Burr Medico will be the attending, and requesting hospice mds to assist with symptom management.  Vicky voiced understanding.

## 2014-11-18 ENCOUNTER — Other Ambulatory Visit: Payer: Medicare Other

## 2014-11-18 ENCOUNTER — Ambulatory Visit: Payer: Medicare Other | Admitting: Hematology and Oncology

## 2014-11-19 ENCOUNTER — Other Ambulatory Visit (HOSPITAL_BASED_OUTPATIENT_CLINIC_OR_DEPARTMENT_OTHER): Payer: Medicare Other

## 2014-11-19 ENCOUNTER — Ambulatory Visit (HOSPITAL_BASED_OUTPATIENT_CLINIC_OR_DEPARTMENT_OTHER): Payer: Medicare Other | Admitting: Hematology

## 2014-11-19 ENCOUNTER — Encounter: Payer: Self-pay | Admitting: Hematology

## 2014-11-19 ENCOUNTER — Telehealth: Payer: Self-pay | Admitting: Hematology

## 2014-11-19 VITALS — BP 125/51 | HR 80 | Temp 98.3°F | Resp 18 | Ht 65.0 in | Wt 148.9 lb

## 2014-11-19 DIAGNOSIS — C61 Malignant neoplasm of prostate: Secondary | ICD-10-CM

## 2014-11-19 DIAGNOSIS — I509 Heart failure, unspecified: Secondary | ICD-10-CM

## 2014-11-19 DIAGNOSIS — D649 Anemia, unspecified: Secondary | ICD-10-CM | POA: Insufficient documentation

## 2014-11-19 DIAGNOSIS — N185 Chronic kidney disease, stage 5: Secondary | ICD-10-CM

## 2014-11-19 LAB — CBC & DIFF AND RETIC
BASO%: 0.2 % (ref 0.0–2.0)
Basophils Absolute: 0 10*3/uL (ref 0.0–0.1)
EOS%: 1.3 % (ref 0.0–7.0)
Eosinophils Absolute: 0.1 10*3/uL (ref 0.0–0.5)
HEMATOCRIT: 23.4 % — AB (ref 38.4–49.9)
HEMOGLOBIN: 7.7 g/dL — AB (ref 13.0–17.1)
Immature Retic Fract: 13.3 % — ABNORMAL HIGH (ref 3.00–10.60)
LYMPH%: 24 % (ref 14.0–49.0)
MCH: 32.2 pg (ref 27.2–33.4)
MCHC: 32.9 g/dL (ref 32.0–36.0)
MCV: 97.9 fL (ref 79.3–98.0)
MONO#: 1 10*3/uL — ABNORMAL HIGH (ref 0.1–0.9)
MONO%: 11.1 % (ref 0.0–14.0)
NEUT#: 5.6 10*3/uL (ref 1.5–6.5)
NEUT%: 63.4 % (ref 39.0–75.0)
Platelets: 252 10*3/uL (ref 140–400)
RBC: 2.39 10*6/uL — ABNORMAL LOW (ref 4.20–5.82)
RDW: 18.6 % — ABNORMAL HIGH (ref 11.0–14.6)
Retic %: 4.13 % — ABNORMAL HIGH (ref 0.80–1.80)
Retic Ct Abs: 98.71 10*3/uL — ABNORMAL HIGH (ref 34.80–93.90)
WBC: 8.8 10*3/uL (ref 4.0–10.3)
lymph#: 2.1 10*3/uL (ref 0.9–3.3)

## 2014-11-19 NOTE — Progress Notes (Signed)
[place]  [phone] Fax:([phone]) [place] consult Note   Patient Care Team: Rexene Agent, MD as PCP - General (Nephrology) Kathie Rhodes, MD as Consulting Physician (Urology) [name], MD as Consulting Physician (Hematology) 11/19/2014  CHIEF COMPLAINTS:  Follow up anemia of chronic disease  HISTORY OF PRESENTING ILLNESS:  [name] 81 y.o. male with past medical history of HTN, recently diagnosed to CHF with EF of 20-25%, prostate cancer, currently on treatment for elevated PSA, is here because of anemia   He was found to have abnormal CBC from at least 2010, with Hb around 11, over the past 5 years, his Hb has been 6-11, he had bilateral knee surgery and a left shoulder surgery, and required blood transfusion once.  He denied any bleeding episodes including hematochezia, melana, hemoptysis, hematuria or epitaxis. No mucosal bleeding or easy bruising. His last colonoscopy was more than 5 years, which was normal per pt.   He has developed bilateral lower extremity edema in the past few months, and a new onset right arm edema. He has mild to moderate dyspnea on exertion. He was evaluated by cardiologist last week, and echocardiogram from yesterday showed EF 25-30%. He lives alone, was still able to handle his daily living, but was not able to get out of a chair this morning without assistance.  He denies recent chest pain on exertion, shortness of breath on minimal exertion, pre-syncopal episodes, or palpitations. The patient was prescribed oral iron supplements and he takes 1 pill daily for the past 4 years, but did not help much.   INTERIM HISTORY: [name] returns for follow-up. He  has been doing poorly in the past months. She was seen by his nephrologist Dr. Joelyn Oms recently, and hemodialysis was recommended due to his worsening renal function. However patient declined. He complains of very low appetite, extremely fatigued, not to be able  to do much at home. He still lives alone, and his daughters comes in to take care of him at home. His primary care physician referred him to hospice last week, he was not able to enroll due to his medication Gillermina Phy), he is under their palliative care now. He started Aranesp injection 2 weeks ago by Dr. Joelyn Oms.   MEDICAL HISTORY:  Past Medical History  Diagnosis Date  . Hypertension   . Cancer 06-19-12    Hx. 15 yrs ago -seed implant-PSA remains elevated somewhat.  . Arthritis     Osteoarthritis(right shoulder)-knees, hip . s/p LTKA  . Transfusion history 06-19-12    history s/p '11- LTKA  . Gout   . Dysrhythmia 06-19-12    medical record note shows hx R)BBB-. A.Fib/ flutter-not present  . Frequency of urination   . Anemia   . Swelling     10-03-13 both legs swells, right is greater  . Atrial flutter 02/10/2010    Qualifier: Diagnosis of  By: [name], MD, [name]     SURGICAL HISTORY: Past Surgical History  Procedure Laterality Date  . Joint replacement  06-19-12    '11 -LTKA  . Cataract extraction, bilateral    . Minor hemorrhoidectomy    . Radioactive seed implant  06-19-12    many yrs ago > 15 yrs  . Total hip arthroplasty Left 06/29/2012    Procedure: LEFT TOTAL HIP ARTHROPLASTY ANTERIOR APPROACH;  Surgeon: Mcarthur Rossetti, MD;  Location: [facility];  Service: Orthopedics;  Laterality: Left;  Left Total Hip Arthroplasty, Anterior Approach  .  Total knee arthroplasty Right 10/10/2013    Procedure: RIGHT TOTAL KNEE ARTHROPLASTY;  Surgeon: Mcarthur Rossetti, MD;  Location: [facility];  Service: Orthopedics;  Laterality: Right;    SOCIAL HISTORY: History   Social History  . Marital Status: Widowed    Spouse Name: N/A  . Number of Children: One daughter lives in [place]  . Years of Education: N/A   Occupational History  . He is retired from post service    Social History Main Topics  . Smoking status: Former Smoker    Types: Cigarettes    Quit date: 06/19/1980    . Smokeless tobacco: Not on file  . Alcohol Use: 1.8 oz/week    3 Shots of liquor per week     Comment: 10 -12 ounces daily  . Drug Use: No  . Sexual Activity: Not Currently   Other Topics Concern  . Not on file   Social History Narrative    FAMILY HISTORY: Family History  Problem Relation Age of Onset  . Cancer Brother 5    colon cancer   . Cancer Brother     colon cancer ?   Marland Kitchen Cancer Brother     colon cancer     ALLERGIES:  is allergic to aspirin.  MEDICATIONS:  Current Outpatient Prescriptions  Medication Sig Dispense Refill  . acetaminophen (TYLENOL) 500 MG tablet Take 500 mg by mouth every 6 (six) hours as needed for moderate pain.    Marland Kitchen acetaminophen-codeine (TYLENOL #3) 300-30 MG per tablet   0  . allopurinol (ZYLOPRIM) 100 MG tablet Take 100 mg by mouth every evening.     . enzalutamide (XTANDI) 40 MG capsule Take 160 mg by mouth daily. Take 4 tablets at one time daily.    . ferrous sulfate 325 (65 FE) MG tablet Take 325 mg by mouth daily with breakfast.    . Multiple Vitamin (MULTIVITAMIN WITH MINERALS) TABS Take 1 tablet by mouth every morning.    . Omega-3 Fatty Acids (FISH OIL) 1200 MG CAPS Take 1,200 mg by mouth daily.    . ondansetron (ZOFRAN) 4 MG tablet TAKE 1 TABLET BY MOUTH EVERY 4 HOURS AS NEEDED FOR NAUSEA/VOMITING  12  . pravastatin (PRAVACHOL) 20 MG tablet Take 20 mg by mouth daily.     . Tamsulosin HCl (FLOMAX) 0.4 MG CAPS Take 0.4 mg by mouth at bedtime.    . traMADol (ULTRAM) 50 MG tablet Take 50 mg by mouth as needed.      No current facility-administered medications for this visit.   Facility-Administered Medications Ordered in Other Visits  Medication Dose Route Frequency Provider Last Rate Last Dose  . lactated ringers infusion    Continuous PRN Janine Ores, CRNA 0 mL/hr at 08/16/13 0730      REVIEW OF SYSTEMS:   Constitutional: Denies fevers, chills or abnormal night sweats Eyes: Denies blurriness of vision, double vision or  watery eyes Ears, nose, mouth, throat, and face: Denies mucositis or sore throat Respiratory: Denies cough, dyspnea or wheezes Cardiovascular: Denies palpitation, chest discomfort or lower extremity swelling Gastrointestinal:  Denies nausea, heartburn or change in bowel habits Skin: Denies abnormal skin rashes Lymphatics: Denies new lymphadenopathy or easy bruising Neurological:Denies numbness, tingling or new weaknesses Behavioral/Psych: Mood is stable, no new changes  All other systems were reviewed with the patient and are negative.  PHYSICAL EXAMINATION: ECOG PERFORMANCE STATUS: 3 - Symptomatic, >50% confined to bed  Filed Vitals:   11/19/14 1021  BP: 125/51  Pulse: 80  Temp: 98.3 F (36.8 C)  Resp: 18   GENERAL:alert, no distress , chronic ill-appearing  SKIN: skin color, texture, turgor are normal, no rashes or significant lesions EYES: normal, conjunctiva are pink and non-injected, sclera clear OROPHARYNX:no exudate, no erythema and lips, buccal mucosa, and tongue normal  NECK: supple, thyroid normal size, non-tender, without nodularity LYMPH:  no palpable lymphadenopathy in the cervical, axillary or inguinal LUNGS: clear to auscultation and percussion with normal breathing effort HEART: regular rate & rhythm and no murmurs and no lower extremity edema ABDOMEN:abdomen soft, non-tender and normal bowel sounds Musculoskeletal:no cyanosis of digits and no clubbing  PSYCH: alert & oriented x 3 with fluent speech NEURO: no focal motor/sensory deficits  LABORATORY DATA:  I have reviewed the data as listed CBC Latest Ref Rng 11/19/2014 11/05/2014 10/21/2014  WBC 4.0 - 10.3 10e3/uL 8.8 - 5.3  Hemoglobin 13.0 - 17.1 g/dL 7.7(L) 7.0(L) 7.3(L)  Hematocrit 38.4 - 49.9 % 23.4(L) - 21.2(L)  Platelets 140 - 400 10e3/uL 252 - 181    CMP Latest Ref Rng 07/24/2014 06/20/2014 10/14/2013  Glucose 70 - 99 mg/dL 124(H) 106 105(H)  BUN 6 - 23 mg/dL 65(H) 61.5(H) 69(H)  Creatinine 0.40 - 1.50  mg/dL 2.41(H) 3.2(HH) 2.50(H)  Sodium 135 - 145 mEq/L 133(L) 137 138  Potassium 3.5 - 5.1 mEq/L 3.8 4.6 4.3  Chloride 96 - 112 mEq/L 95(L) - 99  CO2 19 - 32 mEq/L 30 18(L) 26  Calcium 8.4 - 10.5 mg/dL 10.3 10.3 9.7  Total Protein 6.4 - 8.3 g/dL - 7.1 -  Total Bilirubin 0.20 - 1.20 mg/dL - 0.38 -  Alkaline Phos 40 - 150 U/L - 120 -  AST 5 - 34 U/L - 25 -  ALT 0 - 55 U/L - 14 -     RADIOGRAPHIC STUDIES: I have personally reviewed the radiological images as listed and agreed with the findings in the report.  US Abdomen Complete 07/03/2014    IMPRESSION: 1. Increased hepatic echotexture is consistent with fatty infiltration. There is a stable simple appearing cystic structure in the right correction left hepatic lobe. A stable septated cystic structure is present in the left lobe is well. No cirrhotic changes are demonstrated. 2. There is sludge within the gallbladder without evidence of acute cholecystitis. 3. Stable increased renal cortical echotexture consistent with medical renal disease. There is no hydronephrosis. 4. The spleen is normal in size.   Electronically Signed   By: David  Martinique   On: 07/03/2014 09:13   ECHO 06/19/2014 Impressions:  - Severe global reduction in LV function; grade 1 diastolic dysfunction; mild LAE; moderately reduced RV function; mild TR with severely elevated pulmonary pressure.  ASSESSMENT & PLAN:  81 year old African-American male, with multiple comorbidities, present to my clinic to evaluate his anemia.  1. Normocytic hypo-productive anemia, likely anemia of iron deficiency and chronic disease secondary to CKD -His hemoglobin on initial presentation was 8.3, with MCV 94, absolute reticular count 36, consistent with normocytic hypo-productive anemia. -Ferritin 694, iron 29, saturation 11%. His high ferritin level is possibly related to his multiple medical issues especially a prostate cancer. Low saturation is supported for iron deficiency, especially  in the setting of stage 4 CKD. I recommended IV Feraheme, he has tried oral iron which did not help much. -No lab evidence of hemolysis. Negative ANA, RA, hepatitis B, C., Normal folic acid and X45 level. SPEP/UPEP were negative. His erythropoietin level was normal. His testosterone level is low secondary to his prostate cancer  treatment, which also contributes to his anemia. -Alcohol cessation were discussed with patient, his willing to stop. -I discussed bone marrow biopsy to ruled out bone marrow disease or bone metastasis from prostate cancer. His recent bone scan was negative. Patient declined biopsy -He started Aranesp 2 weeks ago, hemoglobin improved from 7 to7.5, he is scheduled to have the next one year..  - I discussed the role of blood transfusion, giving his symptomatic anemia. He declined at this point    2. CKD stage 5 -Continue follow-up with his nephrologist Dr. Joelyn Oms.   3. Prostate cancer initial T1cM diagnosed in March 2004, Gleason score 6, biochemical recurrence in September 2009, on Lupron and [place]  - he recently had a bone scan which was negative for disease. -He is on Lupron and and and enzalutamide, managed by Dr. Karsten Ro.  -He is on Xtandi  4. CHF with EF 25-30% -Follow up with his cardiologist.  -We discussed anemia may contributes to his heart failure  5. Bilateral lower extremity and right upper extremity edema -I obtained an ultrasound Doppler today which was negative for DVT. -He is on diuretics.  Follow-up:  -He is thinking about hospice, but he has to give up some of his medical treatment if enrolls to hospice  -He is getting Aranesp at Dr. Adin Hector office, he does not want other anemia work up or blood transfusion, I'll only see him as needed in the future.  All questions were answered. The patient knows to call the clinic with any problems, questions or concerns. I spent 25 minutes counseling the patient face to face. The total time spent in the  appointment was 30 minutes and more than 50% was on counseling.     [name], MD 11/19/2014 11:22 AM   Addendum:  Results for [name], [name] (MRN [id number]) as of 11/02/2014 21:42  Ref. Range 10/21/2014 09:51  Iron Latest Ref Range: 42-163 ug/dL 116  UIBC Latest Ref Range: 117-376 ug/dL 163  TIBC Latest Ref Range: 202-409 ug/dL 279  %SAT Latest Ref Range: 20-55 % 42  Ferritin Latest Ref Range: 22-316 ng/ml 1,848 (H)   His iron level is adequate. I have spoken with his nephrologist Dr. Joelyn Oms, who agrees with Aranesp and we will start him on injection at his office.  [name]

## 2014-11-19 NOTE — Telephone Encounter (Signed)
Per 7/20 pof no f/u needed. Patient/relative aware. avs printed and given to patient relative.

## 2014-11-20 ENCOUNTER — Ambulatory Visit: Payer: Medicare Other | Admitting: Podiatry

## 2014-11-21 ENCOUNTER — Ambulatory Visit: Payer: Medicare Other

## 2014-12-02 ENCOUNTER — Encounter (HOSPITAL_COMMUNITY): Payer: Medicare Other

## 2014-12-19 ENCOUNTER — Telehealth: Payer: Self-pay | Admitting: Cardiovascular Disease

## 2014-12-19 ENCOUNTER — Telehealth: Payer: Self-pay | Admitting: Hematology

## 2014-12-19 NOTE — Telephone Encounter (Signed)
New message      Niece of Mr Bink called to thank Dr [name] and staff for their excellent service in caring for their uncle.  Patient died on 2015-01-08.

## 2014-12-19 NOTE — Telephone Encounter (Signed)
I returned call to Hoyle Sauer, the pts niece, to extend our condolences on the loss of her uncle. She expressed gratitude towards Dr [name] and his staff for taking such good care of the pt. She is aware that I am forwarding this message to Dr [name].

## 2015-01-01 DEATH — deceased

## 2015-01-23 NOTE — Telephone Encounter (Signed)
This encounter was open in error

## 2015-02-17 ENCOUNTER — Other Ambulatory Visit: Payer: Self-pay | Admitting: Hematology

## 2015-07-17 ENCOUNTER — Other Ambulatory Visit: Payer: Self-pay | Admitting: Nurse Practitioner

## 2015-12-18 IMAGING — CR DG CHEST 2V
2 series · 2 of 2 positions shown · non-contrast
Comparison: Chest radiograph 06/19/2012

CLINICAL DATA: Pre-op evaluation for a total knee replacement.

EXAM:
CHEST  2 VIEW

[w chest pa]
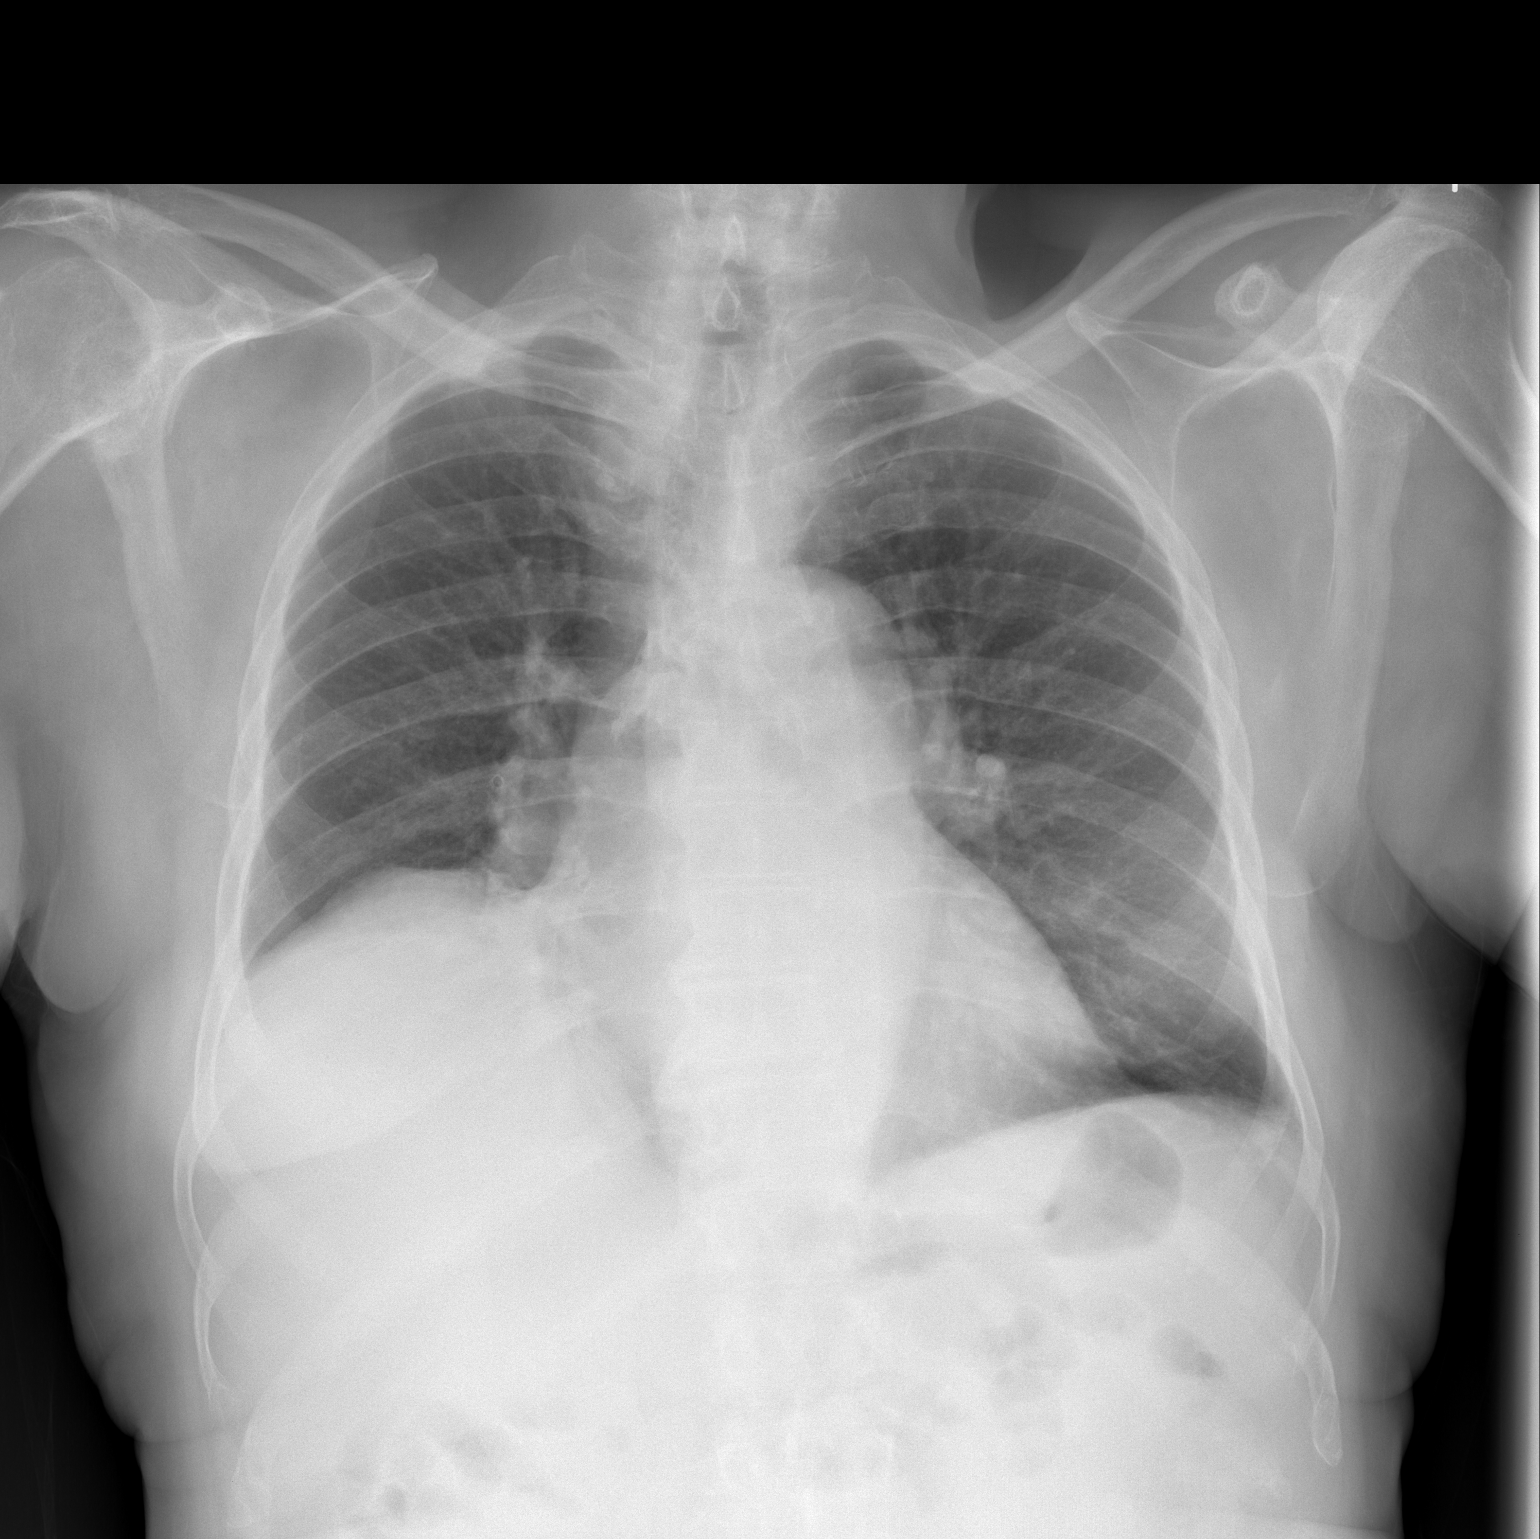

[w chest lat]
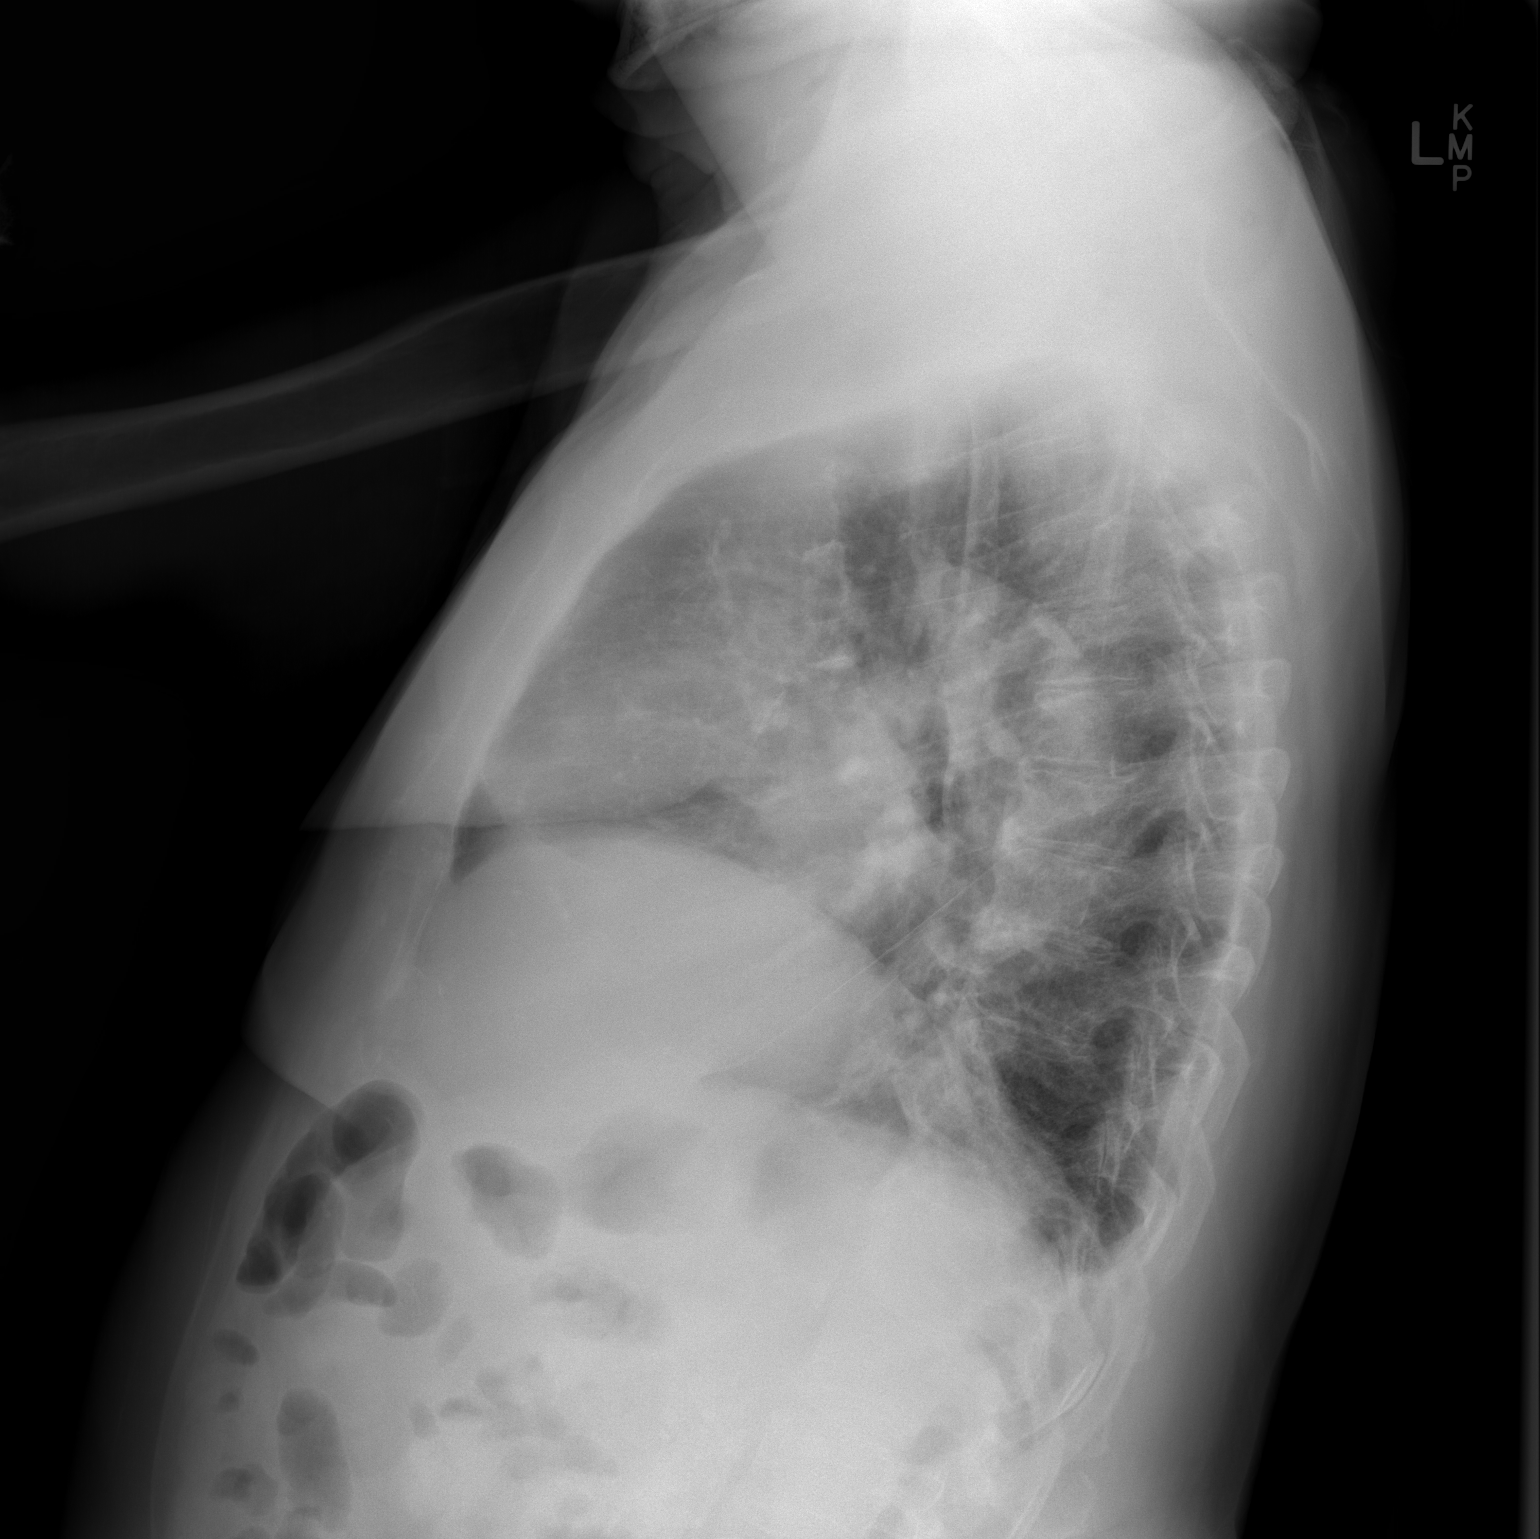

[2 of 2 positions shown; findings below may reference images not displayed]

FINDINGS: Two views of the chest demonstrate chronic elevation of the right
hemidiaphragm. The lungs are clear. Stable appearance of the heart
and mediastinum. The trachea is midline. No acute bone abnormality.
IMPRESSION: No active cardiopulmonary disease.

## 2015-12-27 IMAGING — US US RENAL
1 series · 14 of 25 positions shown · non-contrast
Comparison: None.

CLINICAL DATA: Hypertension.

EXAM:
RENAL/URINARY TRACT ULTRASOUND COMPLETE

[Series 1: us renal · 0.25mm/px · 14 of 25 slices shown]
[im 1/25]
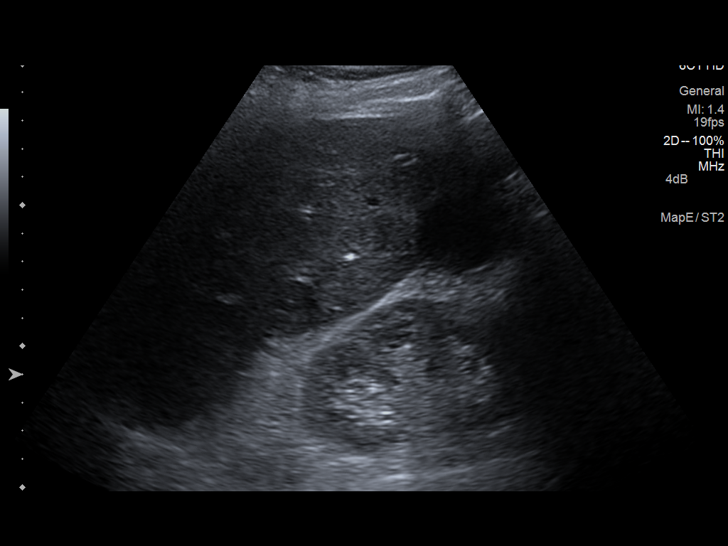
[im 3/25]
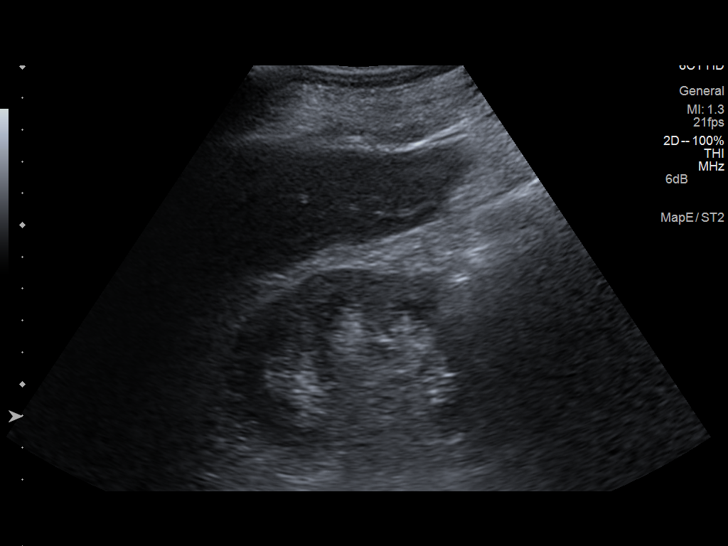
[im 5/25]
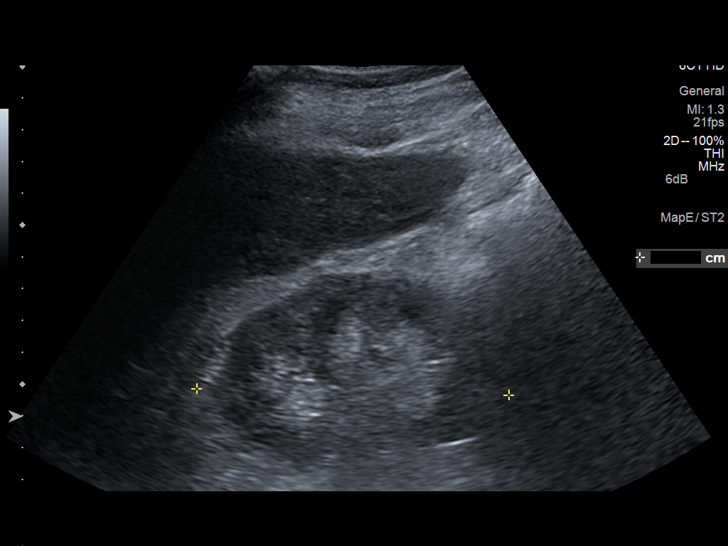
[im 7/25]
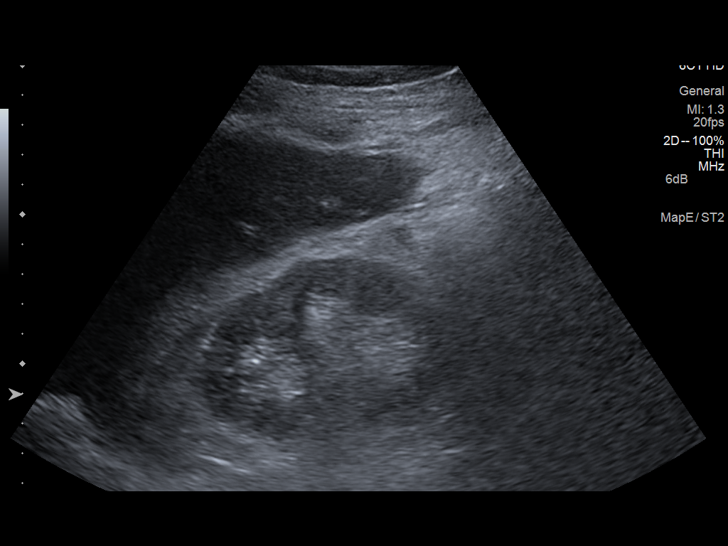
[im 9/25]
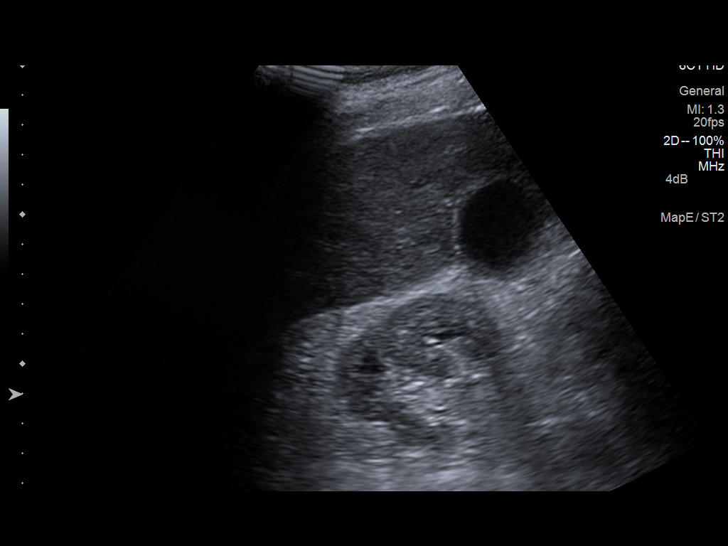
[im 10/25]
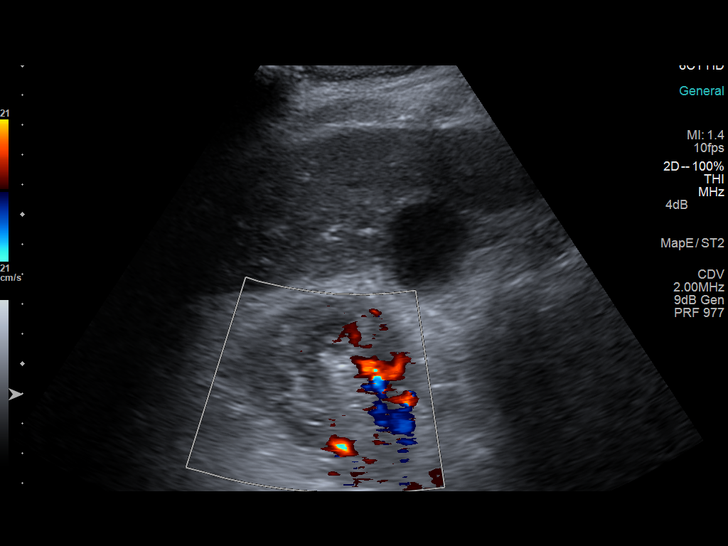
[im 12/25]
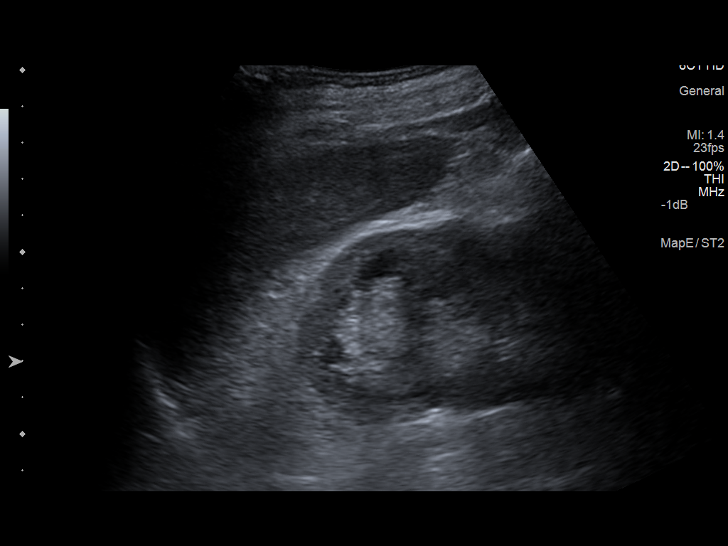
[im 14/25]
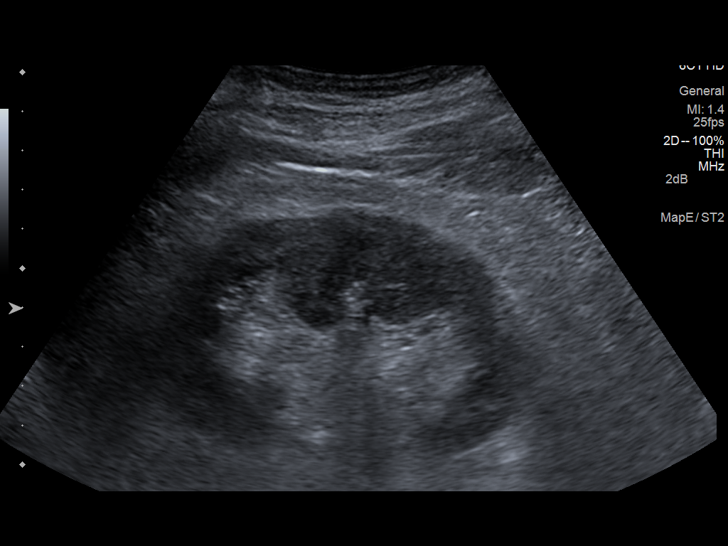
[im 16/25]
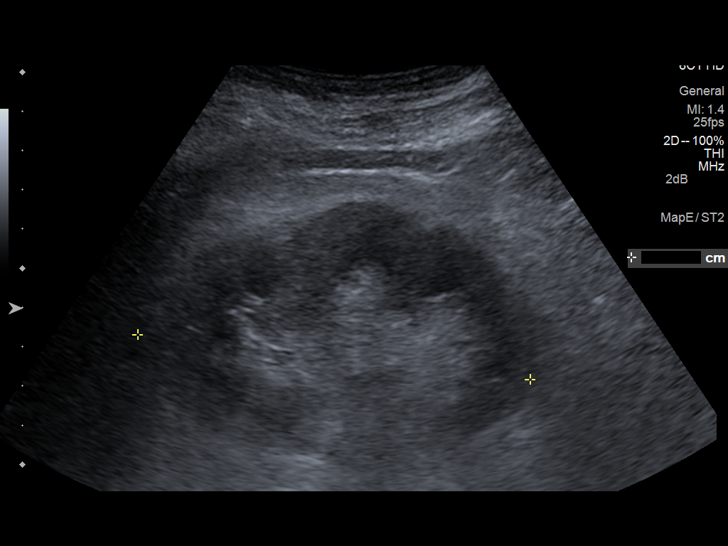
[im 17/25]
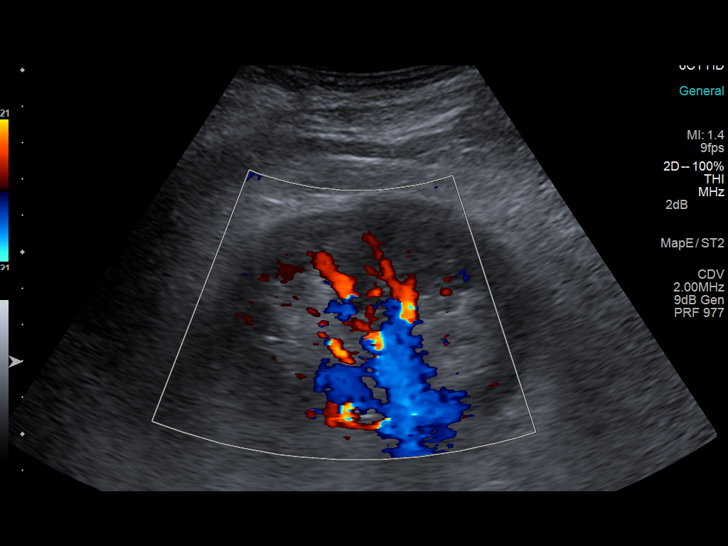
[im 19/25]
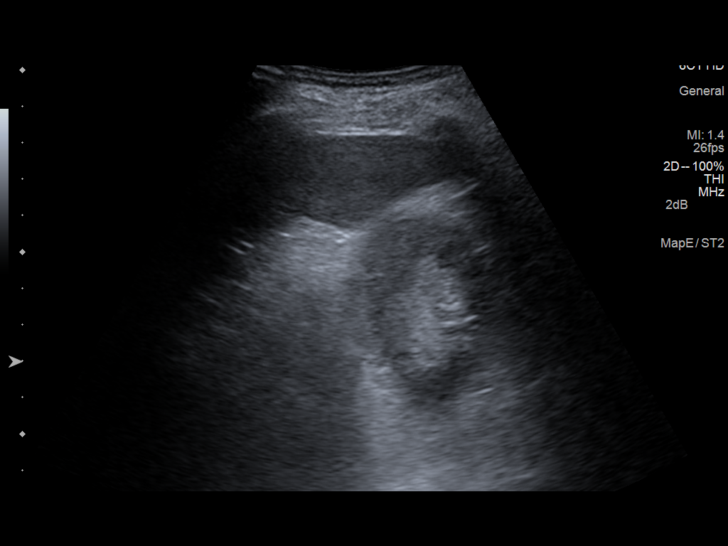
[im 21/25]
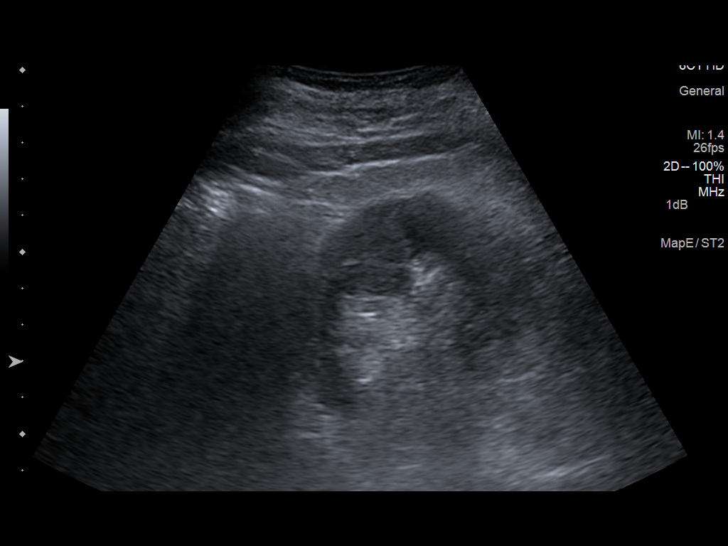
[im 23/25]
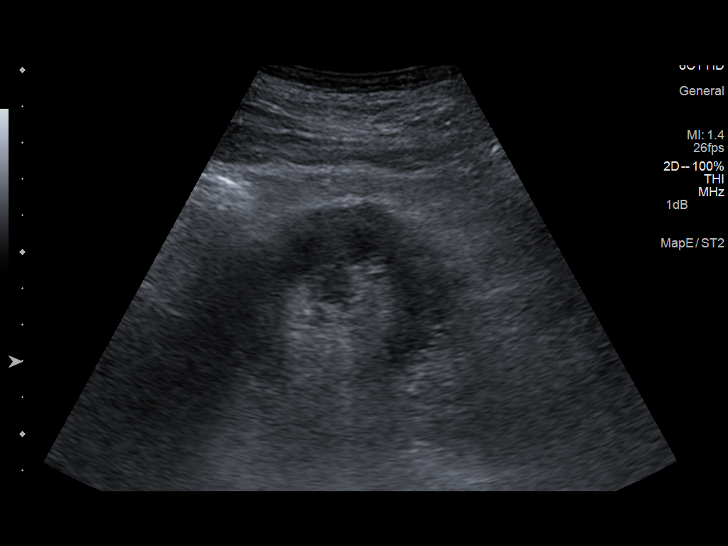
[im 25/25]
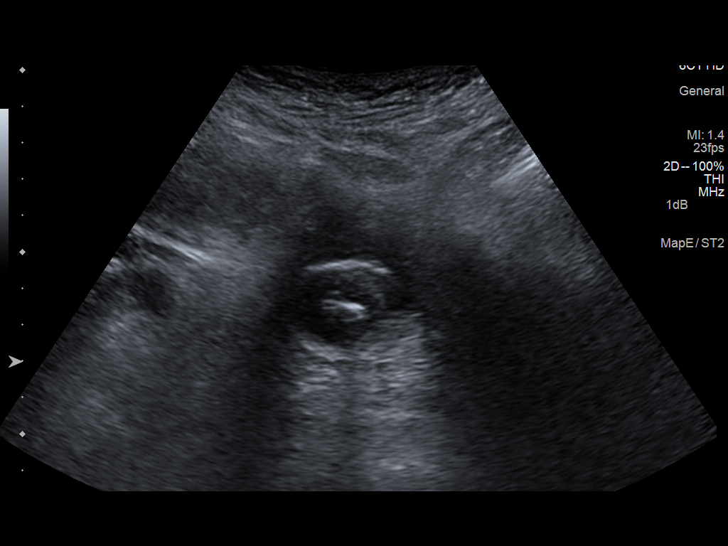

[14 of 25 positions shown; findings below may reference images not displayed]

FINDINGS: Right Kidney:

Length: 9.8 cm. The kidney is slightly echogenic suggesting chronic
medical renal disease. No hydronephrosis.

Left Kidney:

Length: 10.1 cm. The kidney is slightly echogenic suggesting chronic
medical renal disease. No hydronephrosis.

Bladder:

Foley catheter in bladder.  Bladder nondistended.
IMPRESSION: Echogenic kidneys suggesting chronic medical renal disease. No
hydronephrosis. No bladder distention. Foley catheter in bladder.
